# Patient Record
Sex: Female | Born: 1950 | Race: White | Hispanic: No | Marital: Married | Smoking: Never smoker
Health system: Southern US, Community
[De-identification: ages and names within clinical notes are randomized; demographics above are authoritative.]

## PROBLEM LIST (undated history)

## (undated) DIAGNOSIS — E079 Disorder of thyroid, unspecified: Secondary | ICD-10-CM

## (undated) DIAGNOSIS — I10 Essential (primary) hypertension: Secondary | ICD-10-CM

## (undated) HISTORY — PX: OTHER SURGICAL HISTORY: SHX169

## (undated) HISTORY — PX: ABDOMINAL HYSTERECTOMY: SHX81

---

## 2004-02-27 ENCOUNTER — Ambulatory Visit (HOSPITAL_BASED_OUTPATIENT_CLINIC_OR_DEPARTMENT_OTHER): Admission: RE | Admit: 2004-02-27 | Discharge: 2004-02-27 | Payer: Self-pay | Admitting: Orthopedic Surgery

## 2004-02-27 ENCOUNTER — Ambulatory Visit (HOSPITAL_COMMUNITY): Admission: RE | Admit: 2004-02-27 | Discharge: 2004-02-27 | Payer: Self-pay | Admitting: Orthopedic Surgery

## 2011-01-28 ENCOUNTER — Other Ambulatory Visit: Payer: Self-pay | Admitting: Internal Medicine

## 2011-02-02 NOTE — Telephone Encounter (Signed)
Opened in error

## 2015-03-16 ENCOUNTER — Inpatient Hospital Stay (HOSPITAL_BASED_OUTPATIENT_CLINIC_OR_DEPARTMENT_OTHER)
Admission: EM | Admit: 2015-03-16 | Discharge: 2015-03-19 | DRG: 194 | Disposition: A | Discharge status: Home / Self Care | Payer: Medicare Other | Attending: Internal Medicine | Admitting: Internal Medicine

## 2015-03-16 ENCOUNTER — Encounter (HOSPITAL_BASED_OUTPATIENT_CLINIC_OR_DEPARTMENT_OTHER): Payer: Self-pay | Admitting: Emergency Medicine

## 2015-03-16 ENCOUNTER — Emergency Department (HOSPITAL_BASED_OUTPATIENT_CLINIC_OR_DEPARTMENT_OTHER): Payer: Medicare Other

## 2015-03-16 DIAGNOSIS — E871 Hypo-osmolality and hyponatremia: Secondary | ICD-10-CM | POA: Diagnosis present

## 2015-03-16 DIAGNOSIS — R0902 Hypoxemia: Secondary | ICD-10-CM

## 2015-03-16 DIAGNOSIS — J181 Lobar pneumonia, unspecified organism: Principal | ICD-10-CM | POA: Diagnosis present

## 2015-03-16 DIAGNOSIS — R1012 Left upper quadrant pain: Secondary | ICD-10-CM | POA: Diagnosis present

## 2015-03-16 DIAGNOSIS — I10 Essential (primary) hypertension: Secondary | ICD-10-CM | POA: Diagnosis present

## 2015-03-16 DIAGNOSIS — E870 Hyperosmolality and hypernatremia: Secondary | ICD-10-CM | POA: Insufficient documentation

## 2015-03-16 DIAGNOSIS — E039 Hypothyroidism, unspecified: Secondary | ICD-10-CM | POA: Diagnosis present

## 2015-03-16 DIAGNOSIS — E869 Volume depletion, unspecified: Secondary | ICD-10-CM | POA: Diagnosis present

## 2015-03-16 DIAGNOSIS — W19XXXA Unspecified fall, initial encounter: Secondary | ICD-10-CM | POA: Diagnosis present

## 2015-03-16 DIAGNOSIS — T502X5A Adverse effect of carbonic-anhydrase inhibitors, benzothiadiazides and other diuretics, initial encounter: Secondary | ICD-10-CM | POA: Diagnosis present

## 2015-03-16 DIAGNOSIS — E876 Hypokalemia: Secondary | ICD-10-CM | POA: Diagnosis present

## 2015-03-16 DIAGNOSIS — J189 Pneumonia, unspecified organism: Secondary | ICD-10-CM | POA: Diagnosis not present

## 2015-03-16 DIAGNOSIS — R7989 Other specified abnormal findings of blood chemistry: Secondary | ICD-10-CM | POA: Diagnosis present

## 2015-03-16 DIAGNOSIS — K219 Gastro-esophageal reflux disease without esophagitis: Secondary | ICD-10-CM | POA: Diagnosis present

## 2015-03-16 DIAGNOSIS — B029 Zoster without complications: Secondary | ICD-10-CM | POA: Diagnosis present

## 2015-03-16 DIAGNOSIS — R0781 Pleurodynia: Secondary | ICD-10-CM | POA: Diagnosis present

## 2015-03-16 HISTORY — DX: Essential (primary) hypertension: I10

## 2015-03-16 HISTORY — DX: Disorder of thyroid, unspecified: E07.9

## 2015-03-16 LAB — CBC WITH DIFFERENTIAL/PLATELET
Band Neutrophils: 14 %
Basophils Absolute: 0 10*3/uL (ref 0.0–0.1)
Basophils Relative: 0 %
EOS ABS: 0 10*3/uL (ref 0.0–0.7)
EOS PCT: 0 %
HEMATOCRIT: 39.9 % (ref 36.0–46.0)
Hemoglobin: 13.6 g/dL (ref 12.0–15.0)
LYMPHS ABS: 0.4 10*3/uL — AB (ref 0.7–4.0)
Lymphocytes Relative: 2 %
MCH: 30 pg (ref 26.0–34.0)
MCHC: 34.1 g/dL (ref 30.0–36.0)
MCV: 88.1 fL (ref 78.0–100.0)
MONO ABS: 1.1 10*3/uL — AB (ref 0.1–1.0)
MYELOCYTES: 1 %
Monocytes Relative: 5 %
NEUTROS ABS: 20.7 10*3/uL — AB (ref 1.7–7.7)
NEUTROS PCT: 78 %
PLATELETS: 377 10*3/uL (ref 150–400)
RBC: 4.53 MIL/uL (ref 3.87–5.11)
RDW: 13 % (ref 11.5–15.5)
WBC: 22.2 10*3/uL — AB (ref 4.0–10.5)

## 2015-03-16 LAB — URINE MICROSCOPIC-ADD ON

## 2015-03-16 LAB — COMPREHENSIVE METABOLIC PANEL
ALBUMIN: 3.1 g/dL — AB (ref 3.5–5.0)
ALT: 23 U/L (ref 14–54)
ANION GAP: 11 (ref 5–15)
AST: 30 U/L (ref 15–41)
Alkaline Phosphatase: 117 U/L (ref 38–126)
BILIRUBIN TOTAL: 1 mg/dL (ref 0.3–1.2)
BUN: 20 mg/dL (ref 6–20)
CALCIUM: 8.7 mg/dL — AB (ref 8.9–10.3)
CO2: 26 mmol/L (ref 22–32)
Chloride: 97 mmol/L — ABNORMAL LOW (ref 101–111)
Creatinine, Ser: 1.14 mg/dL — ABNORMAL HIGH (ref 0.44–1.00)
GFR calc Af Amer: 57 mL/min — ABNORMAL LOW (ref >60)
GFR calc non Af Amer: 49 mL/min — ABNORMAL LOW (ref >60)
GLUCOSE: 142 mg/dL — AB (ref 65–99)
Potassium: 2.9 mmol/L — ABNORMAL LOW (ref 3.5–5.1)
Sodium: 134 mmol/L — ABNORMAL LOW (ref 135–145)
TOTAL PROTEIN: 7.7 g/dL (ref 6.5–8.1)

## 2015-03-16 LAB — URINALYSIS, ROUTINE W REFLEX MICROSCOPIC
Bilirubin Urine: NEGATIVE
GLUCOSE, UA: NEGATIVE mg/dL
Ketones, ur: NEGATIVE mg/dL
Nitrite: NEGATIVE
PROTEIN: NEGATIVE mg/dL
Specific Gravity, Urine: 1.013 (ref 1.005–1.030)
pH: 6 (ref 5.0–8.0)

## 2015-03-16 LAB — STREP PNEUMONIAE URINARY ANTIGEN: STREP PNEUMO URINARY ANTIGEN: NEGATIVE

## 2015-03-16 LAB — I-STAT CG4 LACTIC ACID, ED: Lactic Acid, Venous: 2.03 mmol/L (ref 0.5–2.0)

## 2015-03-16 LAB — TROPONIN I: Troponin I: <0.03 ng/mL (ref <0.031)

## 2015-03-16 MED ORDER — PANTOPRAZOLE SODIUM 40 MG PO TBEC
80.0000 mg | DELAYED_RELEASE_TABLET | Freq: Every day | ORAL | Status: DC
  Administered 2015-03-16 – 2015-03-19 (×4): 80 mg via ORAL
  Filled 2015-03-16 (×4): qty 2

## 2015-03-16 MED ORDER — FENTANYL CITRATE (PF) 100 MCG/2ML IJ SOLN: 50.0000 ug | Freq: Once | INTRAMUSCULAR | Status: DC

## 2015-03-16 MED ORDER — DEXTROSE 5 % IV SOLN
1.0000 g | INTRAVENOUS | Status: DC
  Administered 2015-03-17 – 2015-03-19 (×3): 1 g via INTRAVENOUS
  Filled 2015-03-16 (×3): qty 10

## 2015-03-16 MED ORDER — SODIUM CHLORIDE 0.9 % IJ SOLN
3.0000 mL | Freq: Two times a day (BID) | INTRAMUSCULAR | Status: DC
  Administered 2015-03-17 – 2015-03-19 (×4): 3 mL via INTRAVENOUS

## 2015-03-16 MED ORDER — ENOXAPARIN SODIUM 40 MG/0.4ML ~~LOC~~ SOLN
40.0000 mg | Freq: Every day | SUBCUTANEOUS | Status: DC
  Administered 2015-03-16 – 2015-03-18 (×3): 40 mg via SUBCUTANEOUS
  Filled 2015-03-16 (×3): qty 0.4

## 2015-03-16 MED ORDER — LEVOTHYROXINE SODIUM 88 MCG PO TABS
88.0000 ug | ORAL_TABLET | Freq: Every day | ORAL | Status: DC
  Administered 2015-03-17 – 2015-03-19 (×3): 88 ug via ORAL
  Filled 2015-03-16 (×3): qty 1

## 2015-03-16 MED ORDER — CEFTRIAXONE SODIUM 1 G IJ SOLR
INTRAMUSCULAR | Status: AC
  Filled 2015-03-16: qty 10

## 2015-03-16 MED ORDER — DEXTROSE 5 % IV SOLN: 500.0000 mg | INTRAVENOUS | Status: DC

## 2015-03-16 MED ORDER — CEFTRIAXONE SODIUM 1 G IJ SOLR
1.0000 g | Freq: Once | INTRAMUSCULAR | Status: AC
  Administered 2015-03-16: 1 g via INTRAVENOUS

## 2015-03-16 MED ORDER — ONDANSETRON HCL 4 MG/2ML IJ SOLN: 4.0000 mg | Freq: Four times a day (QID) | INTRAMUSCULAR | Status: DC | PRN

## 2015-03-16 MED ORDER — OXYBUTYNIN CHLORIDE ER 10 MG PO TB24
10.0000 mg | ORAL_TABLET | Freq: Every day | ORAL | Status: DC
  Administered 2015-03-16 – 2015-03-19 (×4): 10 mg via ORAL
  Filled 2015-03-16 (×5): qty 1

## 2015-03-16 MED ORDER — ACETAMINOPHEN 650 MG RE SUPP: 650.0000 mg | Freq: Four times a day (QID) | RECTAL | Status: DC | PRN

## 2015-03-16 MED ORDER — POTASSIUM CHLORIDE CRYS ER 20 MEQ PO TBCR
40.0000 meq | EXTENDED_RELEASE_TABLET | Freq: Once | ORAL | Status: AC
  Administered 2015-03-16: 40 meq via ORAL
  Filled 2015-03-16: qty 2

## 2015-03-16 MED ORDER — SODIUM CHLORIDE 0.9 % IV BOLUS (SEPSIS)
1000.0000 mL | Freq: Once | INTRAVENOUS | Status: AC
  Administered 2015-03-16: 1000 mL via INTRAVENOUS

## 2015-03-16 MED ORDER — SODIUM CHLORIDE 0.9 % IV BOLUS (SEPSIS)
500.0000 mL | Freq: Once | INTRAVENOUS | Status: AC
  Administered 2015-03-16: 500 mL via INTRAVENOUS

## 2015-03-16 MED ORDER — ACETAMINOPHEN 325 MG PO TABS: 650.0000 mg | ORAL_TABLET | Freq: Four times a day (QID) | ORAL | Status: DC | PRN

## 2015-03-16 MED ORDER — ESTRADIOL 1 MG PO TABS
1.0000 mg | ORAL_TABLET | Freq: Every day | ORAL | Status: DC
  Administered 2015-03-16 – 2015-03-19 (×4): 1 mg via ORAL
  Filled 2015-03-16 (×5): qty 1

## 2015-03-16 MED ORDER — SODIUM CHLORIDE 0.9 % IV SOLN
INTRAVENOUS | Status: DC
  Administered 2015-03-16: 19:00:00 via INTRAVENOUS
  Filled 2015-03-16 (×2): qty 1000

## 2015-03-16 MED ORDER — LEVOFLOXACIN 750 MG PO TABS
750.0000 mg | ORAL_TABLET | Freq: Every day | ORAL | Status: DC
  Administered 2015-03-16: 750 mg via ORAL
  Filled 2015-03-16: qty 1

## 2015-03-16 MED ORDER — HYDROCODONE-ACETAMINOPHEN 5-325 MG PO TABS: 1.0000 | ORAL_TABLET | Freq: Four times a day (QID) | ORAL | Status: DC | PRN

## 2015-03-16 MED ORDER — DEXTROSE 5 % IV SOLN
500.0000 mg | INTRAVENOUS | Status: DC
  Administered 2015-03-16 – 2015-03-18 (×3): 500 mg via INTRAVENOUS
  Filled 2015-03-16 (×4): qty 500

## 2015-03-16 MED ORDER — ONDANSETRON HCL 4 MG PO TABS: 4.0000 mg | ORAL_TABLET | Freq: Four times a day (QID) | ORAL | Status: DC | PRN

## 2015-03-16 MED ORDER — FLUTICASONE PROPIONATE 50 MCG/ACT NA SUSP: 1.0000 | Freq: Every day | NASAL | Status: DC | PRN

## 2015-03-16 NOTE — H&P (Signed)
History and Physical  Karla Farley FAO:[id number] DOB: November 26, 1950 DOA: 03/16/2015   PCP: No primary care provider on file.  Referring Physician: ED/ Evonnie Pat, PA-C  Chief Complaint: cough, malaise  HPI:  64 year old female with a history of hypertension, hypothyroidism, GERD presented with a 5 day history of cough and malaise. Approximately 10 days prior to this admission, the patient went to see her primary care provider for left flank pain. Apparently the patient was diagnosed with possible shingles and started on Valtrex.  The patient never developed a rash, although she stated that her left flank pain had improved with the Valtrex. After this medical issue, the patient had progressive malaise and began developing a cough and dyspnea on exertion approximate 5 days prior to this admission. The patient denies any hemoptysis, nausea, vomiting, diarrhea. She did not have any fevers or chills. However she developed right-sided flank pain. She attributed this to a mechanical fall approximately 3 days prior to this admission. She has not developed any rashes or synovitis. She denies any abdominal pain, dysuria, hematuria, vaginal discharge. Because of the cough, malaise, and dyspnea on exertion she came to the emergency department for further evaluation.  In emergency department workup revealed WBC 22.2 with a chest x-ray showing right middle lobe opacity suggestive of pneumonia. Lactic acid was 2.03. The patient was afebrile and hemodynamically stable. The patient was started on ceftriaxone and levofloxacin. Assessment/Plan: Community-acquired pneumonia -CURB 65 score= 2 -Continue ceftriaxone, start azithromycin -IV fluids -Blood cultures 2 sets--please note that this has been obtained after antibiotics -Pulmonary hygiene -Urine Legionella antigen, urine Streptococcus pneumoniae antigen Right greater than left flank pain -UA and urine culture -If there is significant pyuria and  hematuria, may need CT renal protocol -however, on physical examination, the patient has minimal to no pain with percussion -Hydrocodone when necessary pain Hypertension - hold losartan, HCTZ and metoprolol succinate due to self blood pressure Hypokalemia -Likely due to HCTZ -Replete -Check magnesium Hypothyroidism  -Continue Synthroid  GERD  -Start PPI  Hyponatremia -Secondary to poor oral intake/volume depletion -IV fluids      Past Medical History  Diagnosis Date  . Thyroid disease   . Hypertension    History reviewed. No pertinent past surgical history. Social History:  reports that she has never smoked. She does not have any smokeless tobacco history on file. She reports that she does not drink alcohol or use illicit drugs.   History reviewed. No pertinent family history.   Allergies  Allergen Reactions  . Demerol [Meperidine]     hypotension      Prior to Admission medications   Medication Sig Start Date End Date Taking? Authorizing Provider  estradiol (ESTRACE) 1 MG tablet Take 1 mg by mouth daily.   Yes Historical Provider, MD  fluticasone (FLONASE) 50 MCG/ACT nasal spray Place 1 spray into both nostrils daily as needed for allergies or rhinitis.    Yes Historical Provider, MD  hydrochlorothiazide (HYDRODIURIL) 25 MG tablet Take 25 mg by mouth daily.   Yes Historical Provider, MD  HYDROcodone-acetaminophen (NORCO/VICODIN) 5-325 MG tablet Take 1 tablet by mouth every 6 (six) hours as needed for moderate pain.   Yes Historical Provider, MD  ibuprofen (ADVIL,MOTRIN) 200 MG tablet Take 200 mg by mouth every 6 (six) hours as needed for mild pain or moderate pain.   Yes Historical Provider, MD  levothyroxine (SYNTHROID, LEVOTHROID) 88 MCG tablet Take 88 mcg by mouth daily before breakfast.   Yes Historical Provider,  MD  losartan (COZAAR) 25 MG tablet Take 25 mg by mouth daily.   Yes Historical Provider, MD  metoprolol succinate (TOPROL-XL) 100 MG 24 hr tablet Take  100 mg by mouth daily. Take with or immediately following a meal.   Yes Historical Provider, MD  omeprazole (PRILOSEC) 40 MG capsule Take 40 mg by mouth daily.   Yes Historical Provider, MD  oxybutynin (DITROPAN-XL) 10 MG 24 hr tablet Take 10 mg by mouth daily.    Yes Historical Provider, MD    Review of Systems:  Constitutional:  No weight loss, night sweats, Fevers, chills, fatigue.  Head&Eyes: No headache.  No vision loss.  No eye pain or scotoma ENT:  No Difficulty swallowing,Tooth/dental problems,Sore throat,  No ear ache, post nasal drip,  Cardio-vascular:  No chest pain, Orthopnea, PND, swelling in lower extremities,  dizziness, palpitations  GI:  No  abdominal pain, nausea, vomiting, diarrhea, loss of appetite, hematochezia, melena, heartburn, indigestion, Resp:   No coughing up of blood .No wheezing.No chest wall deformity  Skin:  no rash or lesions.  GU:  no dysuria, change in color of urine, no urgency or frequency. No flank pain.  Musculoskeletal:  No joint pain or swelling. No decreased range of motion. No back pain.  Psych:  No change in mood or affect. No depression or anxiety. Neurologic:  no dysesthesia, no focal weakness, no vision loss. No syncope  Physical Exam: Filed Vitals:   03/16/15 1133 03/16/15 1349 03/16/15 1429 03/16/15 1612  BP: 120/71 121/71 120/57 114/64  Pulse: 108 96 88 92  Temp: 98.3 F (36.8 C)   97.7 F (36.5 C)  TempSrc: Oral   Oral  Resp: Height:  (1.626 m)    (1.626 m)  Weight: 73.936 kg (163 lb)   73.6 kg (162 lb 4.1 oz)  SpO2: 100% 95% 95% 94%   General:  A&O x 3, NAD, nontoxic, pleasant/cooperative Head/Eye: No conjunctival hemorrhage, no icterus, [place]/AT, No nystagmus ENT:  No icterus,  No thrush, good dentition, no pharyngeal exudate Neck:  No masses, no lymphadenpathy, no bruits CV:  RRR, no rub, no gallop, no S3 Lung: Bibasilar crackles, recurrent left. No wheezing. Good air movement  Abdomen:  soft/NT, +BS, nondistended, no peritoneal signs no costovertebral tenderness; No hepatosplenomegaly;  Ext: No cyanosis, No rashes, No petechiae, No lymphangitis, No edema Neuro: CNII-XII intact, strength 4/5 in bilateral upper and lower extremities, no dysmetria  Labs on Admission:  Basic Metabolic Panel:  Recent Labs Lab 03/16/15 1221  NA 134*  K 2.9*  CL 97*  CO2 26  GLUCOSE 142*  BUN 20  CREATININE 1.14*  CALCIUM 8.7*   Liver Function Tests:  Recent Labs Lab 03/16/15 1221  AST 30  ALT 23  ALKPHOS 117  BILITOT 1.0  PROT 7.7  ALBUMIN 3.1*   No results for input(s): LIPASE, AMYLASE in the last 168 hours. No results for input(s): AMMONIA in the last 168 hours. CBC:  Recent Labs Lab 03/16/15 1221  WBC 22.2*  NEUTROABS 20.7*  HGB 13.6  HCT 39.9  MCV 88.1  PLT 377   Cardiac Enzymes:  Recent Labs Lab 03/16/15 1221  TROPONINI <0.03   BNP: Invalid input(s): POCBNP CBG: No results for input(s): GLUCAP in the last 168 hours.  Radiological Exams on Admission: Dg Chest 2 View  03/16/2015  CLINICAL DATA:  LEFT upper flank pain.  Nonproductive cough. EXAM: CHEST  2 VIEW COMPARISON:  None. FINDINGS: Normal cardiac  silhouette. RIGHT infrahilar opacity seen only on the frontal projection likely represents RIGHT middle lobe pneumonia. LEFT lung is clear. No pneumothorax. No acute osseous abnormality. IMPRESSION: Concern for RIGHT middle lobe pneumonia. Followup PA and lateral chest X-ray is recommended in 3-4 weeks following trial of antibiotic therapy to ensure resolution and exclude underlying malignancy. Electronically Signed   By: Genevive BiStewart  Edmunds M.D.   On: 03/16/2015 12:13        Time spent:60 minutes Code Status:   FULL Family Communication:   Husband updated at bedside   Caryl Fate, DO  [facility] Pager [phone]  If 7PM-7AM, please contact night-coverage [email or web address] Password [email or web address] 03/16/2015, 6:04 PM

## 2015-03-16 NOTE — ED Notes (Signed)
Comfort measures provided ?

## 2015-03-16 NOTE — ED Notes (Signed)
Discussed pain management as ordered by MD, pt states while lying still she is comfortable, would rather wait to have IV pain med

## 2015-03-16 NOTE — ED Notes (Signed)
States has poor appetite, unable to sleep due to cough.

## 2015-03-16 NOTE — ED Notes (Signed)
Pt reports left upper flank pain that started 1 week ago, seen at [facility] and told she had shingles, pt then fell at home next day and developed right upper flank pain, pt has no rash or s/s of shingles to upper left, now pt reports severe pain to left upper flank

## 2015-03-16 NOTE — ED Notes (Signed)
Presents today with complaints with pain left breast pain, primarily below left breast, onset over 1 week ago. [facility], was told she maybe having onset of shingles. Medication given was Valtrex, was instructed to complete dose. Returned last Saturday, was told to continue medication. Went to work on Monday, developed a cough. States has felt worse as time as passed

## 2015-03-16 NOTE — Progress Notes (Signed)
64 yr old F presents to [facility] with reports of  left upper flank pain that started 1 week ago, seen at [facility] and told she had shingles, pt then fell at home next day and developed right upper flank pain, pt has no rash or s/s of shingles to upper left, now pt reports severe pain to left upper flank, found to have a RML PNA, wbc 22K, tachy , but stable, K 2.9, repleting, admit to tele

## 2015-03-16 NOTE — ED Notes (Signed)
Pt up to bathroom , required steady device due to pt becoming short of breath with walking.

## 2015-03-16 NOTE — ED Notes (Signed)
PA-C notified of K level results

## 2015-03-16 NOTE — ED Provider Notes (Signed)
CSN: [id number]     Arrival date & time 03/16/15  1125 History   First MD Initiated Contact with Patient 03/16/15 1204     Chief Complaint  Patient presents with  . Flank Pain     (Consider location/radiation/quality/duration/timing/severity/associated sxs/prior Treatment) HPI Comments: Patient presents with multiple complaints. She developed left-sided flank pain approximately 10 days ago and was seen by PCP. She was diagnosed with possible shingles program started on Valtrex and was taking pain medications which helped. She has never developed a rash. Symptoms persisted but improved up until 6 days ago when patient developed a cough and worsening malaise. The left flank pain return. Patient did have a fall at one point because she was weak and had right flank pain afterwards. No fevers. Coughing was worse last night. She reports poor appetite has not been eating and drinking well. No chest pains. No abdominal pains. No vomiting, diarrhea, or urinary symptoms. No other treatments prior to arrival.  Patient is a 64 y.o. female presenting with flank pain. The history is provided by the patient.  Flank Pain Associated symptoms include coughing. Pertinent negatives include no abdominal pain, chest pain, congestion, fever, headaches, myalgias, nausea, rash, sore throat or vomiting.    Past Medical History  Diagnosis Date  . Thyroid disease   . Hypertension    History reviewed. No pertinent past surgical history. History reviewed. No pertinent family history. Social History  Substance Use Topics  . Smoking status: Never Smoker   . Smokeless tobacco: None  . Alcohol Use: No   OB History    No data available     Review of Systems  Constitutional: Negative for fever.  HENT: Negative for congestion, rhinorrhea and sore throat.   Eyes: Negative for redness.  Respiratory: Positive for cough and wheezing (Slight this morning). Negative for shortness of breath.   Cardiovascular: Negative  for chest pain.  Gastrointestinal: Negative for nausea, vomiting, abdominal pain and diarrhea.  Genitourinary: Positive for flank pain. Negative for dysuria.  Musculoskeletal: Negative for myalgias.  Skin: Negative for rash.  Neurological: Negative for headaches.    Allergies  Demerol  Home Medications   Prior to Admission medications   Medication Sig Start Date End Date Taking? Authorizing Provider  estradiol (ESTRACE) 1 MG tablet Take 1 mg by mouth daily.   Yes Historical Provider, MD  fluticasone (FLONASE) 50 MCG/ACT nasal spray Place into both nostrils daily.   Yes Historical Provider, MD  HYDROcodone-acetaminophen (NORCO/VICODIN) 5-325 MG tablet Take 1 tablet by mouth every 6 (six) hours as needed for moderate pain.   Yes Historical Provider, MD  levothyroxine (SYNTHROID, LEVOTHROID) 88 MCG tablet Take 88 mcg by mouth daily before breakfast.   Yes Historical Provider, MD  losartan (COZAAR) 25 MG tablet Take 25 mg by mouth daily.   Yes Historical Provider, MD  metoprolol succinate (TOPROL-XL) 100 MG 24 hr tablet Take 100 mg by mouth daily. Take with or immediately following a meal.   Yes Historical Provider, MD  nabumetone (RELAFEN) 750 MG tablet Take 750 mg by mouth daily.   Yes Historical Provider, MD  omeprazole (PRILOSEC) 40 MG capsule Take 40 mg by mouth daily.   Yes Historical Provider, MD  oxybutynin (DITROPAN-XL) 10 MG 24 hr tablet Take 10 mg by mouth at bedtime.   Yes Historical Provider, MD  valACYclovir (VALTREX) 1000 MG tablet Take 1,000 mg by mouth 2 (two) times daily.   Yes Historical Provider, MD   BP 120/71 mmHg  Pulse 108  Temp(Src) 98.3 F (36.8 C) (Oral)  Resp 20  Ht  (1.626 m)  Wt 73.936 kg  BMI 27.97 kg/m2  SpO2 100%   Physical Exam  Constitutional: She appears well-developed and well-nourished.  HENT:  Head: Normocephalic and atraumatic.  Eyes: Conjunctivae are normal. Right eye exhibits no discharge. Left eye exhibits no discharge.  Neck:  Normal range of motion. Neck supple.  Cardiovascular: Regular rhythm and normal heart sounds.  Tachycardia present.   No murmur heard. Slight tachycardia  Pulmonary/Chest: Effort normal. No respiratory distress. She has no wheezes. She has rales (right middle lobe). She exhibits tenderness (Left lateral ribs and right lateral ribs, moderately tender to palpation).  Abdominal: Soft. There is no tenderness.  Neurological: She is alert.  Skin: Skin is warm and dry.  Psychiatric: She has a normal mood and affect.  Nursing note and vitals reviewed.   ED Course  Procedures (including critical care time) Labs Review Labs Reviewed  CBC WITH DIFFERENTIAL/PLATELET - Abnormal; Notable for the following:    WBC 22.2 (*)    Neutro Abs 20.7 (*)    Lymphs Abs 0.4 (*)    Monocytes Absolute 1.1 (*)    All other components within normal limits  COMPREHENSIVE METABOLIC PANEL - Abnormal; Notable for the following:    Sodium 134 (*)    Potassium 2.9 (*)    Chloride 97 (*)    Glucose, Bld 142 (*)    Creatinine, Ser 1.14 (*)    Calcium 8.7 (*)    Albumin 3.1 (*)    GFR calc non Af Amer 49 (*)    GFR calc Af Amer 57 (*)    All other components within normal limits  URINALYSIS, ROUTINE W REFLEX MICROSCOPIC (NOT AT [facility]) - Abnormal; Notable for the following:    Hgb urine dipstick TRACE (*)    Leukocytes, UA SMALL (*)    All other components within normal limits  URINE MICROSCOPIC-ADD ON - Abnormal; Notable for the following:    Squamous Epithelial / LPF 0-5 (*)    Bacteria, UA RARE (*)    All other components within normal limits  I-STAT CG4 LACTIC ACID, ED - Abnormal; Notable for the following:    Lactic Acid, Venous 2.03 (*)    All other components within normal limits  CULTURE, BLOOD (ROUTINE X 2)  CULTURE, BLOOD (ROUTINE X 2)  URINE CULTURE  TROPONIN I  LACTIC ACID, PLASMA  BASIC METABOLIC PANEL  CBC  LEGIONELLA PNEUMOPHILA SEROGP 1 UR AG  STREP PNEUMONIAE URINARY ANTIGEN  MAGNESIUM     Imaging Review Dg Chest 2 View  03/16/2015  CLINICAL DATA:  LEFT upper flank pain.  Nonproductive cough. EXAM: CHEST  2 VIEW COMPARISON:  None. FINDINGS: Normal cardiac silhouette. RIGHT infrahilar opacity seen only on the frontal projection likely represents RIGHT middle lobe pneumonia. LEFT lung is clear. No pneumothorax. No acute osseous abnormality. IMPRESSION: Concern for RIGHT middle lobe pneumonia. Followup PA and lateral chest X-ray is recommended in 3-4 weeks following trial of antibiotic therapy to ensure resolution and exclude underlying malignancy. Electronically Signed   By: Genevive Bi M.D.   On: 03/16/2015 12:13   I have personally reviewed and evaluated these images and lab results as part of my medical decision-making.   EKG Interpretation None       12:47 PM Patient seen and examined. Work-up initiated. Medications ordered.   Vital signs reviewed and are as follows: BP 120/71 mmHg  Pulse 108  Temp(Src) 98.3  F (36.8 C) (Oral)  Resp 20  Ht 5\' 4"  (1.626 m)  Wt 73.936 kg  BMI 27.97 kg/m2  SpO2 100%  Patient appears uncomfortable, has mild tachycardia on arrival, white count greater than 20,000.  Patient discussed with Dr. Dalene SeltzerSchlossman.   Discussed findings with patient. Offered admission and patient would like to stay in the hospital until she begins to feel better. I feel that given her labs and findings today that this is reasonable. Will call for admission.  Spoke with Dr. Susie CassetteAbrol. [facility]  MDM   Final diagnoses:  Community acquired pneumonia   [facility].    [place]ua Riddik Senna, [place]PA-C 03/16/15 1953  Alvira MondayErin Schlossman, MD 03/24/15 1929

## 2015-03-16 NOTE — ED Notes (Signed)
Phone Hand Off report provided to rec RN, opportunity for questions provided

## 2015-03-16 NOTE — ED Notes (Signed)
No rash noted at left flank, states pain is at left flank now and radiates to front to under left breast.

## 2015-03-16 NOTE — ED Notes (Signed)
Phone Report provided to [facility] Transport Team 

## 2015-03-16 NOTE — ED Notes (Signed)
PA-C in with pt

## 2015-03-17 ENCOUNTER — Inpatient Hospital Stay (HOSPITAL_COMMUNITY): Payer: Medicare Other

## 2015-03-17 DIAGNOSIS — E876 Hypokalemia: Secondary | ICD-10-CM

## 2015-03-17 DIAGNOSIS — I10 Essential (primary) hypertension: Secondary | ICD-10-CM

## 2015-03-17 DIAGNOSIS — J181 Lobar pneumonia, unspecified organism: Principal | ICD-10-CM

## 2015-03-17 DIAGNOSIS — R7989 Other specified abnormal findings of blood chemistry: Secondary | ICD-10-CM | POA: Diagnosis present

## 2015-03-17 DIAGNOSIS — R0781 Pleurodynia: Secondary | ICD-10-CM | POA: Diagnosis present

## 2015-03-17 DIAGNOSIS — R791 Abnormal coagulation profile: Secondary | ICD-10-CM

## 2015-03-17 LAB — BASIC METABOLIC PANEL
Anion gap: 6 (ref 5–15)
BUN: 12 mg/dL (ref 6–20)
CO2: 28 mmol/L (ref 22–32)
Calcium: 8.1 mg/dL — ABNORMAL LOW (ref 8.9–10.3)
Chloride: 105 mmol/L (ref 101–111)
Creatinine, Ser: 0.98 mg/dL (ref 0.44–1.00)
GFR calc Af Amer: >60 mL/min (ref >60)
GFR, EST NON AFRICAN AMERICAN: 59 mL/min — AB (ref >60)
Glucose, Bld: 118 mg/dL — ABNORMAL HIGH (ref 65–99)
POTASSIUM: 3.6 mmol/L (ref 3.5–5.1)
SODIUM: 139 mmol/L (ref 135–145)

## 2015-03-17 LAB — D-DIMER, QUANTITATIVE (NOT AT ARMC): D DIMER QUANT: 2.86 ug{FEU}/mL — AB (ref 0.00–0.50)

## 2015-03-17 LAB — CBC
HEMATOCRIT: 34 % — AB (ref 36.0–46.0)
Hemoglobin: 11.5 g/dL — ABNORMAL LOW (ref 12.0–15.0)
MCH: 30.3 pg (ref 26.0–34.0)
MCHC: 33.8 g/dL (ref 30.0–36.0)
MCV: 89.7 fL (ref 78.0–100.0)
PLATELETS: 299 10*3/uL (ref 150–400)
RBC: 3.79 MIL/uL — ABNORMAL LOW (ref 3.87–5.11)
RDW: 13.4 % (ref 11.5–15.5)
WBC: 16.8 10*3/uL — AB (ref 4.0–10.5)

## 2015-03-17 LAB — MAGNESIUM: Magnesium: 1.9 mg/dL (ref 1.7–2.4)

## 2015-03-17 IMAGING — CT CT ANGIO CHEST
1 of 8 series · 17 of 36 positions shown · IV contrast (Iohexol (Omnipaque 350))
Comparison: No priors.

CLINICAL DATA: 63-year-old female with pleuritic chest pain.

EXAM:
CT ANGIOGRAPHY CHEST WITH CONTRAST
TECHNIQUE: Multidetector CT imaging of the chest was performed using the
standard protocol during bolus administration of intravenous
contrast. Multiplanar CT image reconstructions and MIPs were
obtained to evaluate the vascular anatomy.
CONTRAST:  100mL OMNIPAQUE IOHEXOL 350 MG/ML SOLN

[Series 406: thins pacs · axial · 0.67mm/px · z∈[-26,+227]mm · 17 of 285 slices shown]
[im 16/285  lung]
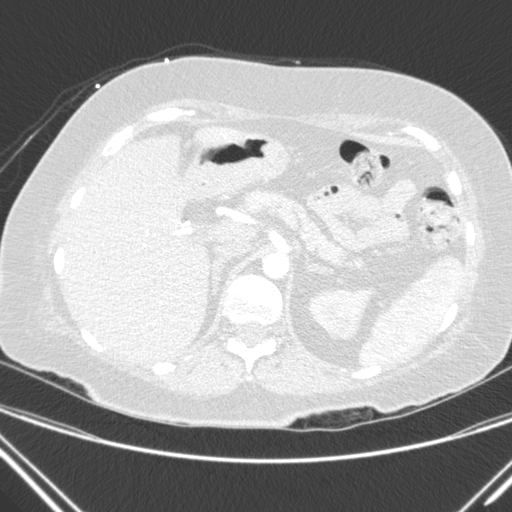
[im 32/285  mediastinal]
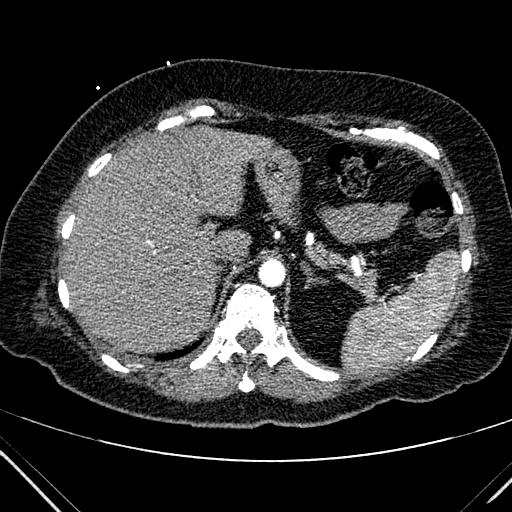
[im 48/285  lung]
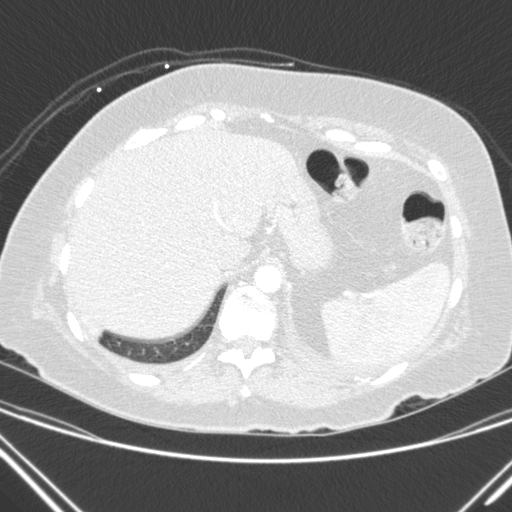
[im 64/285  mediastinal]
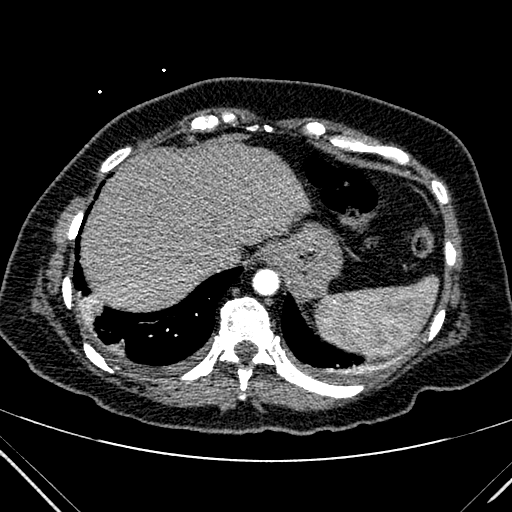
[im 79/285  lung]
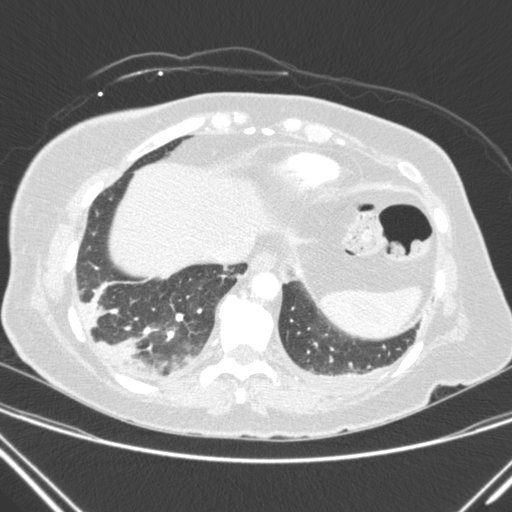
[im 95/285  mediastinal]
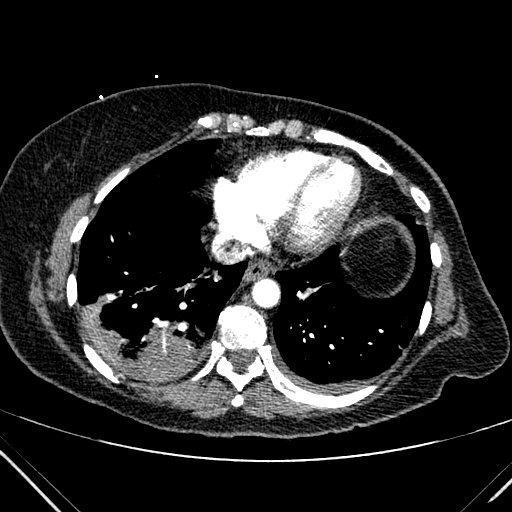
[im 111/285  lung]
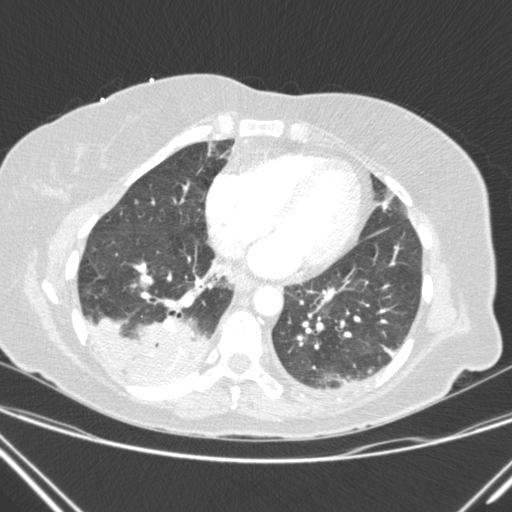
[im 127/285  mediastinal]
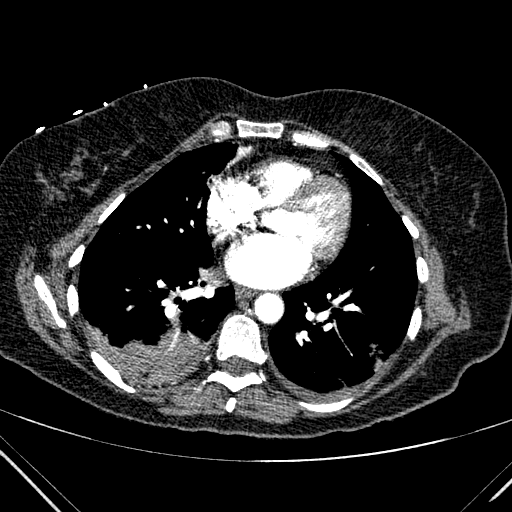
[im 143/285  lung]
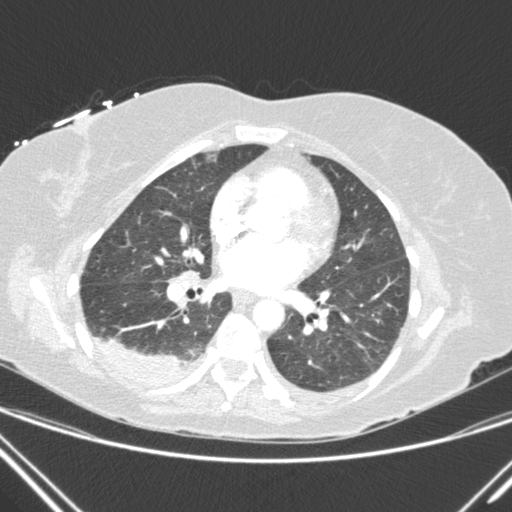
[im 158/285  mediastinal]
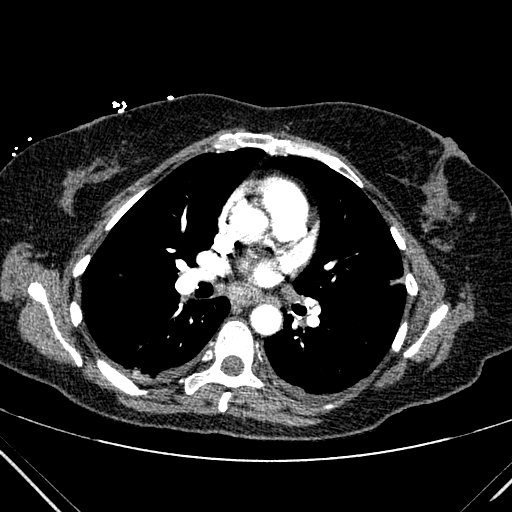
[im 174/285  lung]
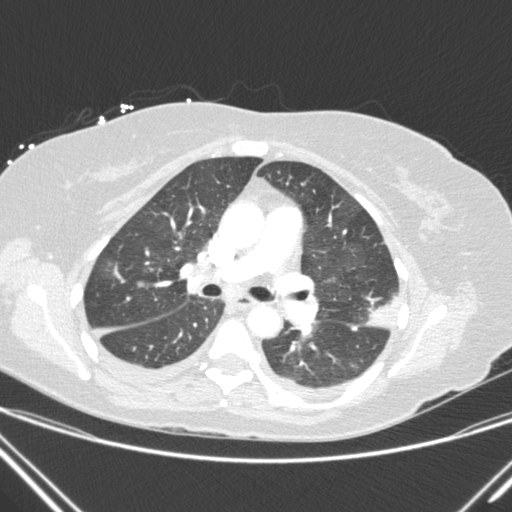
[im 190/285  mediastinal]
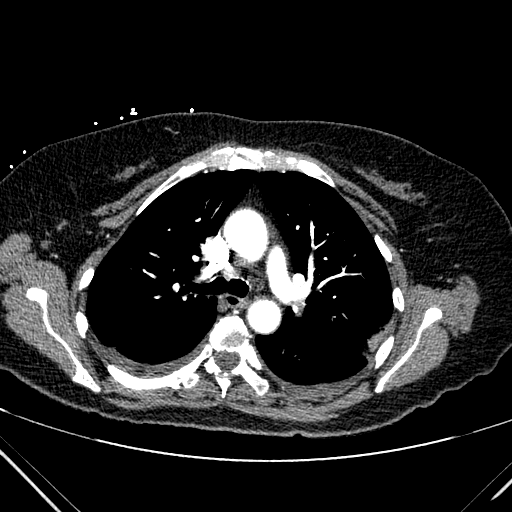
[im 206/285  lung]
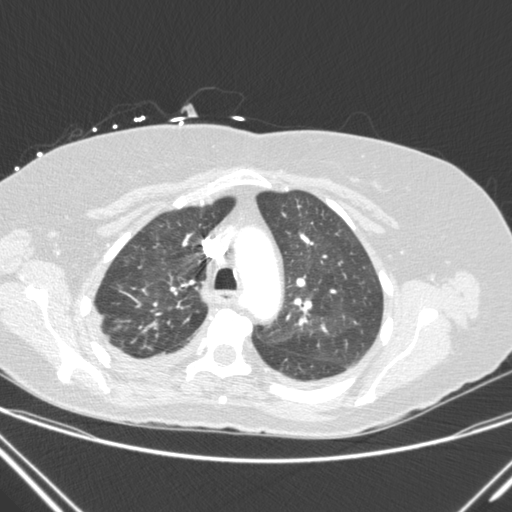
[im 221/285  mediastinal]
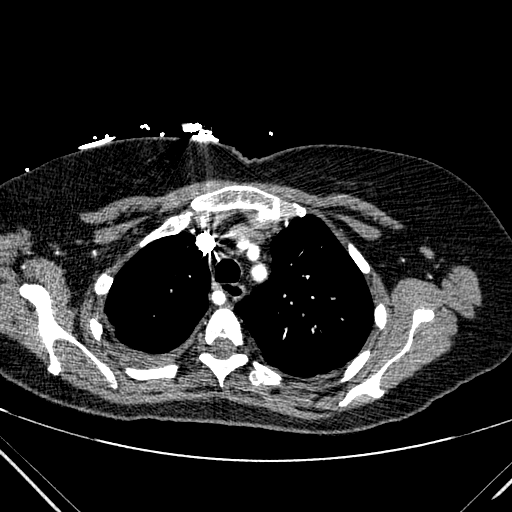
[im 237/285  lung]
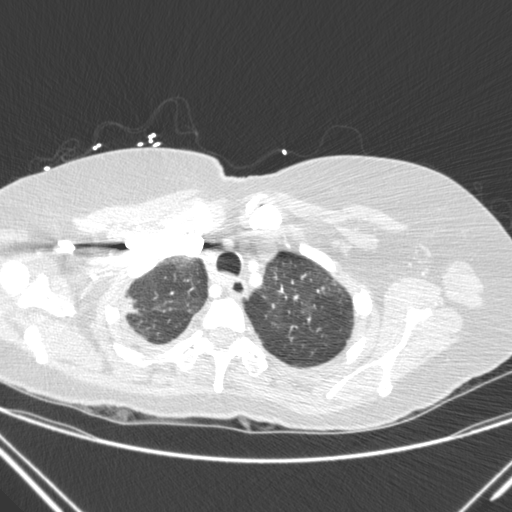
[im 253/285  mediastinal]
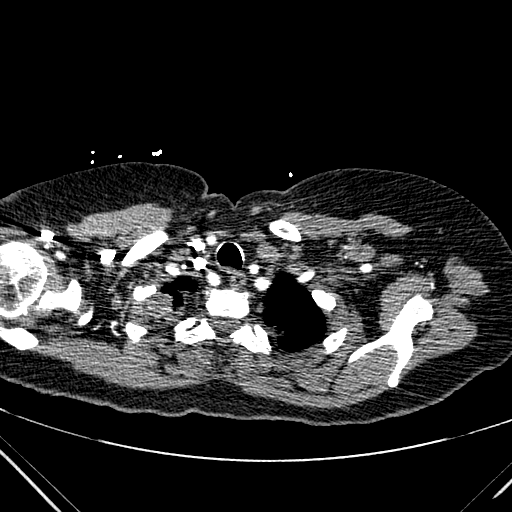
[im 269/285  lung]
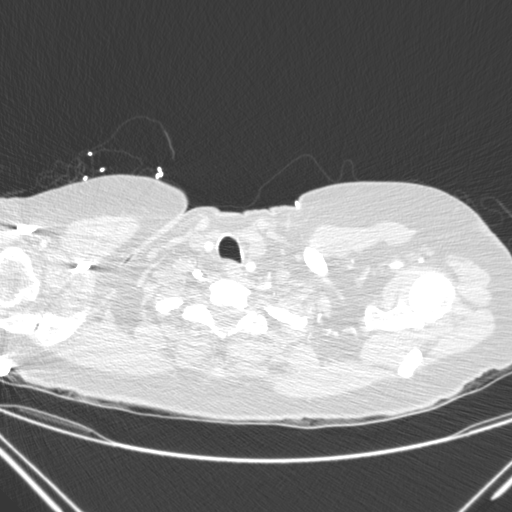

[17 of 36 positions shown; findings below may reference images not displayed]

FINDINGS: Mediastinum/Lymph Nodes: There are no filling defects within the
pulmonary arterial tree to suggest underlying pulmonary embolism.
Heart size is normal. There is no significant pericardial fluid,
thickening or pericardial calcification. There is atherosclerosis of
the thoracic aorta, the great vessels of the mediastinum and the
coronary arteries, including calcified atherosclerotic plaque in the
left main and left circumflex coronary arteries. Prominent
borderline enlarged bilateral hilar lymph nodes measuring up to 9 mm
are presumably reactive. Mildly enlarged 1 cm short axis subcarinal
lymph node, also likely reactive. Several densely calcified left
hilar lymph nodes are noted. Aberrant right subclavian artery
(normal anatomical variant) incidentally noted. Esophagus is
unremarkable in appearance. No axillary lymphadenopathy.

Lungs/Pleura: Multifocal airspace consolidation throughout the lungs
bilaterally, with the largest area of consolidation in the posterior
aspect of the right lower lobe. Several of the areas of airspace
consolidation are slightly nodular in appearance, however, the
overall appearance is favored to reflect a multilobar pneumonia.
Trace dependent bilateral pleural effusions. Mild diffuse bronchial
wall thickening. Septal thickening throughout the lower lobes of the
lungs bilaterally. A few scattered calcified granulomas are noted in
the left lung.

Upper Abdomen: Status post cholecystectomy.

Musculoskeletal/Soft Tissues: There are no aggressive appearing
lytic or blastic lesions noted in the visualized portions of the
skeleton.

Review of the MIP images confirms the above findings.
IMPRESSION: 1. No evidence of pulmonary embolism.
2. Multilobar pneumonia, most severe in the right lower lobe, as
above.
3. Trace bilateral pleural effusions.
4. Atherosclerosis, including left main and left circumflex coronary
artery disease. Please note that although the presence of coronary
artery calcium documents the presence of coronary artery disease,
the severity of this disease and any potential stenosis cannot be
assessed on this non-gated CT examination. Assessment for potential
risk factor modification, dietary therapy or pharmacologic therapy
may be warranted, if clinically indicated.
5. Sequela of old granulomatous disease, as above.
6. Status post cholecystectomy.

## 2015-03-17 MED ORDER — GUAIFENESIN-DM 100-10 MG/5ML PO SYRP
5.0000 mL | ORAL_SOLUTION | ORAL | Status: DC | PRN
  Administered 2015-03-17 – 2015-03-18 (×2): 5 mL via ORAL
  Filled 2015-03-17 (×3): qty 5

## 2015-03-17 MED ORDER — POTASSIUM CHLORIDE IN NACL 20-0.9 MEQ/L-% IV SOLN
INTRAVENOUS | Status: DC
  Filled 2015-03-17: qty 1000

## 2015-03-17 MED ORDER — IOHEXOL 350 MG/ML SOLN
100.0000 mL | Freq: Once | INTRAVENOUS | Status: AC | PRN
  Administered 2015-03-17: 100 mL via INTRAVENOUS

## 2015-03-17 NOTE — Progress Notes (Signed)
[facility] Progress Note    Patient: Karla Farley     ZOX:[id number]  DOB: Jun 03, 1950     DOA: 03/16/2015 Date of Service: the patient was seen and examined on 03/17/2015 Day 1 of admission.  Subjective: c/o left sided stabbing chest pain worsening with movement and breathing, dry cough, no fever reported Nutrition: able to tolerate oral diet Activity: ambulating in the room Last BM: 03/17/2015  Assessment and Plan: 1. Lobar pneumonia [facility]) Patient presented with complains of left-sided chest pain. She was found to having right-sided pneumonia. She was also having leukocytosis. She denies having any fever or sputum expectoration. Continue ceftriaxone and azithromycin. Next and follow the cultures. Follow other workup.  2. Pleuritic chest pain. Less likely cardiac. Patient is taking estrogen on a daily basis. With this the patient is at high risk for developing thromboembolism. D-dimer is also positive. We will check CT PE protocol to rule out any PE.  3  Essential hypertension Blood pressure currently stable to continue holding her antihypertensive medication.  4  Hypokalemia  Hyponatremia Resolved at present.  5. Hypothyroidism. Continuing Synthroid.  DVT Prophylaxis: subcutaneous Heparin Nutrition: regular diet Advance goals of care discussion: full code  Brief Summary of Hospitalization:  HPI: As per the H&P dictated by Dr. Arbutus Leas on 03/16/2015 Patient presented with 5 day history of cough and malaise. Patient was also reporting to have shingles and was started on Valtrex. He was found to be having WBC of 22 with chest x-ray showed right middle lobe opacity and lactic acid elevation and was admitted for community-acquired pneumonia.  Daily update: 03/16/2015 admitted in the hospital and started on antibiotics. 03/17/2015 CT PE negative Consultants: none Procedures: none Antibiotics: Anti-infectives    Start     Dose/Rate Route Frequency Ordered Stop   03/17/15  1000  azithromycin (ZITHROMAX) 500 mg in dextrose 5 % 250 mL IVPB  Status:  Discontinued     500 mg 250 mL/hr over 60 Minutes Intravenous Every 24 hours 03/16/15 1815 03/16/15 1855   03/17/15 0800  cefTRIAXone (ROCEPHIN) 1 g in dextrose 5 % 50 mL IVPB     1 g 100 mL/hr over 30 Minutes Intravenous Every 24 hours 03/16/15 1815     03/16/15 2100  azithromycin (ZITHROMAX) 500 mg in dextrose 5 % 250 mL IVPB     500 mg 250 mL/hr over 60 Minutes Intravenous Every 24 hours 03/16/15 1855     03/16/15 1345  cefTRIAXone (ROCEPHIN) 1 g in dextrose 5 % 50 mL IVPB     1 g 100 mL/hr over 30 Minutes Intravenous  Once 03/16/15 1337 03/16/15 1401   03/16/15 1342  cefTRIAXone (ROCEPHIN) 1 G injection    Comments:  Juanda Crumble   : cabinet override      03/16/15 1342 03/16/15 1350   03/16/15 1300  levofloxacin (LEVAQUIN) tablet 750 mg  Status:  Discontinued     750 mg Oral Daily 03/16/15 1248 03/16/15 1815     Family Communication: family was present at bedside, opportunity was given to ask question and all questions were answered satisfactorily at the time of interview.  Disposition:  Expected discharge date:03/19/2015 Barriers to safe discharge: improvement in breathing    Intake/Output Summary (Last 24 hours) at 03/17/15 0728 Last data filed at 03/17/15 0600  Gross per 24 hour  Intake 2952.5 ml  Output   1100 ml  Net 1852.5 ml   Filed Weights   03/16/15 1133 03/16/15 1612 03/17/15 0432  Weight: 73.936 kg (  163 lb) 73.6 kg (162 lb 4.1 oz) 73.256 kg (161 lb 8 oz)    Objective: Physical Exam: Filed Vitals:   03/16/15 1612 03/16/15 2017 03/16/15 2325 03/17/15 0432  BP: 114/64 117/47 120/57 127/59  Pulse: 92 83 80 82  Temp: 97.7 F (36.5 C) 98.1 F (36.7 C)  97 F (36.1 C)  TempSrc: Oral Oral  Oral  Resp: 20 18 18 18   Height: 5\' 4"  (1.626 m)     Weight: 73.6 kg (162 lb 4.1 oz)   73.256 kg (161 lb 8 oz)  SpO2: 94% 97% 98% 98%     General: Appear in mild distress, no Rash; Oral  Mucosa moist. Cardiovascular: S1 and S2 Present, no Murmur, no JVD Respiratory: Bilateral Air entry present and bilateral Crackles, no wheezes Abdomen: Bowel Sound present, Soft and no tenderness Extremities: no Pedal edema, no calf tenderness Neurology: Grossly no focal neuro deficit.  Data Reviewed: CBC:  Recent Labs Lab 03/16/15 1221 03/17/15 0238  WBC 22.2* 16.8*  NEUTROABS 20.7*  --   HGB 13.6 11.5*  HCT 39.9 34.0*  MCV 88.1 89.7  PLT 377 299   Basic Metabolic Panel:  Recent Labs Lab 03/16/15 1221 03/17/15 0238  NA 134* 139  K 2.9* 3.6  CL 97* 105  CO2 26 28  GLUCOSE 142* 118*  BUN 20 12  CREATININE 1.14* 0.98  CALCIUM 8.7* 8.1*  MG  --  1.9   Liver Function Tests:  Recent Labs Lab 03/16/15 1221  AST 30  ALT 23  ALKPHOS 117  BILITOT 1.0  PROT 7.7  ALBUMIN 3.1*   No results for input(s): LIPASE, AMYLASE in the last 168 hours. No results for input(s): AMMONIA in the last 168 hours.  Cardiac Enzymes:  Recent Labs Lab 03/16/15 1221  TROPONINI <0.03   BNP (last 3 results) No results for input(s): BNP in the last 8760 hours.  ProBNP (last 3 results) No results for input(s): PROBNP in the last 8760 hours.   CBG: No results for input(s): GLUCAP in the last 168 hours.  No results found for this or any previous visit (from the past 240 hour(s)).   Studies: Ct Angio Chest Pe W/cm &/or Wo Cm  03/17/2015  CLINICAL DATA:  64 year old female with pleuritic chest pain. EXAM: CT ANGIOGRAPHY CHEST WITH CONTRAST TECHNIQUE: Multidetector CT imaging of the chest was performed using the standard protocol during bolus administration of intravenous contrast. Multiplanar CT image reconstructions and MIPs were obtained to evaluate the vascular anatomy. CONTRAST:  100mL OMNIPAQUE IOHEXOL 350 MG/ML SOLN COMPARISON:  No priors. FINDINGS: Mediastinum/Lymph Nodes: There are no filling defects within the pulmonary arterial tree to suggest underlying pulmonary embolism.  Heart size is normal. There is no significant pericardial fluid, thickening or pericardial calcification. There is atherosclerosis of the thoracic aorta, the great vessels of the mediastinum and the coronary arteries, including calcified atherosclerotic plaque in the left main and left circumflex coronary arteries. Prominent borderline enlarged bilateral hilar lymph nodes measuring up to 9 mm are presumably reactive. Mildly enlarged 1 cm short axis subcarinal lymph node, also likely reactive. Several densely calcified left hilar lymph nodes are noted. Aberrant right subclavian artery (normal anatomical variant) incidentally noted. Esophagus is unremarkable in appearance. No axillary lymphadenopathy. Lungs/Pleura: Multifocal airspace consolidation throughout the lungs bilaterally, with the largest area of consolidation in the posterior aspect of the right lower lobe. Several of the areas of airspace consolidation are slightly nodular in appearance, however, the overall appearance is favored to  reflect a multilobar pneumonia. Trace dependent bilateral pleural effusions. Mild diffuse bronchial wall thickening. Septal thickening throughout the lower lobes of the lungs bilaterally. A few scattered calcified granulomas are noted in the left lung. Upper Abdomen: Status post cholecystectomy. Musculoskeletal/Soft Tissues: There are no aggressive appearing lytic or blastic lesions noted in the visualized portions of the skeleton. Review of the MIP images confirms the above findings. IMPRESSION: 1. No evidence of pulmonary embolism. 2. Multilobar pneumonia, most severe in the right lower lobe, as above. 3. Trace bilateral pleural effusions. 4. Atherosclerosis, including left main and left circumflex coronary artery disease. Please note that although the presence of coronary artery calcium documents the presence of coronary artery disease, the severity of this disease and any potential stenosis cannot be assessed on this  non-gated CT examination. Assessment for potential risk factor modification, dietary therapy or pharmacologic therapy may be warranted, if clinically indicated. 5. Sequela of old granulomatous disease, as above. 6. Status post cholecystectomy. Electronically Signed   By: Trudie Reed M.D.   On: 03/17/2015 17:38     Scheduled Meds: . azithromycin  500 mg Intravenous Q24H  . cefTRIAXone (ROCEPHIN)  IV  1 g Intravenous Q24H  . enoxaparin (LOVENOX) injection  40 mg Subcutaneous QHS  . estradiol  1 mg Oral Daily  . levothyroxine  88 mcg Oral QAC breakfast  . oxybutynin  10 mg Oral Daily  . pantoprazole  80 mg Oral Daily  . sodium chloride  3 mL Intravenous Q12H   Continuous Infusions:    Time spent: 30 minutes  Author: Lynden Oxford, MD [facility] Pager: [phone] 03/17/2015 7:28 AM  If 7PM-7AM, please contact night-coverage at [email or web address], password [facility]

## 2015-03-17 NOTE — Progress Notes (Signed)
Utilization review completed.  

## 2015-03-18 DIAGNOSIS — J189 Pneumonia, unspecified organism: Secondary | ICD-10-CM

## 2015-03-18 LAB — BASIC METABOLIC PANEL
Anion gap: 8 (ref 5–15)
BUN: 10 mg/dL (ref 6–20)
CHLORIDE: 105 mmol/L (ref 101–111)
CO2: 25 mmol/L (ref 22–32)
CREATININE: 0.83 mg/dL (ref 0.44–1.00)
Calcium: 8 mg/dL — ABNORMAL LOW (ref 8.9–10.3)
Glucose, Bld: 100 mg/dL — ABNORMAL HIGH (ref 65–99)
POTASSIUM: 3.1 mmol/L — AB (ref 3.5–5.1)
SODIUM: 138 mmol/L (ref 135–145)

## 2015-03-18 LAB — URINE CULTURE: CULTURE: NO GROWTH

## 2015-03-18 LAB — CBC
HCT: 33.1 % — ABNORMAL LOW (ref 36.0–46.0)
Hemoglobin: 11 g/dL — ABNORMAL LOW (ref 12.0–15.0)
MCH: 30.1 pg (ref 26.0–34.0)
MCHC: 33.2 g/dL (ref 30.0–36.0)
MCV: 90.4 fL (ref 78.0–100.0)
PLATELETS: 293 10*3/uL (ref 150–400)
RBC: 3.66 MIL/uL — AB (ref 3.87–5.11)
RDW: 13.4 % (ref 11.5–15.5)
WBC: 12.2 10*3/uL — AB (ref 4.0–10.5)

## 2015-03-18 MED ORDER — POTASSIUM CHLORIDE CRYS ER 20 MEQ PO TBCR
40.0000 meq | EXTENDED_RELEASE_TABLET | Freq: Once | ORAL | Status: AC
  Administered 2015-03-18: 40 meq via ORAL
  Filled 2015-03-18: qty 2

## 2015-03-18 MED ORDER — LOSARTAN POTASSIUM 25 MG PO TABS
25.0000 mg | ORAL_TABLET | Freq: Every day | ORAL | Status: DC
  Administered 2015-03-18 – 2015-03-19 (×2): 25 mg via ORAL
  Filled 2015-03-18 (×2): qty 1

## 2015-03-18 NOTE — Progress Notes (Signed)
[facility] Progress Note    Patient: Karla Farley     ZOX:[id number]  DOB: [birth year]     DOA: 03/16/2015 Date of Service: the patient was seen and examined on 03/18/2015 Day 2 of admission.  Subjective: Pain has improved significantly. Appetite has also improved. Nutrition: able to tolerate oral diet Activity: ambulating in the hallway Last BM: 03/17/2015  Assessment and Plan: 1. Lobar pneumonia [facility]) Patient presented with complains of left-sided chest pain. She was found to having right-sided pneumonia. She was also having leukocytosis. Continue ceftriaxone and azithromycin. Blood cultures remain negative. Streptococcal antigen negative No sputum culture  2. Pleuritic chest pain. Less likely cardiac. Patient is taking estrogen on a daily basis. With this the patient is at high risk for developing thromboembolism. D-dimer is also positive. CT PE is performed which is negative for any blood clot and shows multilobar pneumonia.   3  Essential hypertension Blood pressure currently stable. Will resume losartan  4  Hypokalemia  Hyponatremia Resolved at present.  5. Hypothyroidism. Continuing Synthroid.  DVT Prophylaxis: subcutaneous Heparin Nutrition: regular diet  Advance goals of care discussion: full code  Brief Summary of Hospitalization:  HPI: As per the H&P dictated by Dr. Arbutus Leas on 03/16/2015 Patient presented with 5 day history of cough and malaise. Patient was also reporting to have shingles and was started on Valtrex. He was found to be having WBC of 22 with chest x-ray showed right middle lobe opacity and lactic acid elevation and was admitted for community-acquired pneumonia.  Daily update: 03/16/2015 admitted in the hospital and started on antibiotics. 03/17/2015 CT PE negative Consultants: none Procedures: none Antibiotics: Anti-infectives    Start     Dose/Rate Route Frequency Ordered Stop   03/17/15 1000  azithromycin (ZITHROMAX) 500 mg in dextrose 5  % 250 mL IVPB  Status:  Discontinued     500 mg 250 mL/hr over 60 Minutes Intravenous Every 24 hours 03/16/15 1815 03/16/15 1855   03/17/15 0800  cefTRIAXone (ROCEPHIN) 1 g in dextrose 5 % 50 mL IVPB     1 g 100 mL/hr over 30 Minutes Intravenous Every 24 hours 03/16/15 1815     03/16/15 2100  azithromycin (ZITHROMAX) 500 mg in dextrose 5 % 250 mL IVPB     500 mg 250 mL/hr over 60 Minutes Intravenous Every 24 hours 03/16/15 1855     03/16/15 1345  cefTRIAXone (ROCEPHIN) 1 g in dextrose 5 % 50 mL IVPB     1 g 100 mL/hr over 30 Minutes Intravenous  Once 03/16/15 1337 03/16/15 1401   03/16/15 1342  cefTRIAXone (ROCEPHIN) 1 G injection    Comments:  Juanda Crumble   : cabinet override      03/16/15 1342 03/16/15 1350   03/16/15 1300  levofloxacin (LEVAQUIN) tablet 750 mg  Status:  Discontinued     750 mg Oral Daily 03/16/15 1248 03/16/15 1815     Family Communication: family was present at bedside, opportunity was given to ask question and all questions were answered satisfactorily at the time of interview.  Disposition:  Expected discharge date:03/19/2015 Barriers to safe discharge: improvement in breathing    Intake/Output Summary (Last 24 hours) at 03/18/15 1559 Last data filed at 03/18/15 1400  Gross per 24 hour  Intake   1030 ml  Output   1176 ml  Net   -146 ml   Filed Weights   03/16/15 1612 03/17/15 0432 03/18/15 0441  Weight: 73.6 kg (162 lb 4.1 oz) 73.256 kg (161  lb 8 oz) 72.984 kg (160 lb 14.4 oz)    Objective: Physical Exam: Filed Vitals:   03/17/15 1347 03/17/15 2045 03/18/15 0441 03/18/15 1240  BP: 134/57 135/60 133/69 132/69  Pulse: 90 88 85 88  Temp: 99.2 F (37.3 C) 98.7 F (37.1 C) 98.5 F (36.9 C) 98.2 F (36.8 C)  TempSrc: Oral Oral Oral Oral  Resp: 20 20 17 18   Height:      Weight:   72.984 kg (160 lb 14.4 oz)   SpO2: 95% 94% 93% 96%   General: Appear in mild distress, no Rash; Oral Mucosa moist. Cardiovascular: S1 and S2 Present, no Murmur, no  JVD Respiratory: Bilateral Air entry present and bilateral Crackles, no wheezes Abdomen: Bowel Sound present, Soft and no tenderness Extremities: no Pedal edema, no calf tenderness  Data Reviewed: CBC:  Recent Labs Lab 03/16/15 1221 03/17/15 0238 03/18/15 0352  WBC 22.2* 16.8* 12.2*  NEUTROABS 20.7*  --   --   HGB 13.6 11.5* 11.0*  HCT 39.9 34.0* 33.1*  MCV 88.1 89.7 90.4  PLT 377 299 293   Basic Metabolic Panel:  Recent Labs Lab 03/16/15 1221 03/17/15 0238 03/18/15 0352  NA 134* 139 138  K 2.9* 3.6 3.1*  CL 97* 105 105  CO2 26 28 25   GLUCOSE 142* 118* 100*  BUN 20 12 10   CREATININE 1.14* 0.98 0.83  CALCIUM 8.7* 8.1* 8.0*  MG  --  1.9  --    Liver Function Tests:  Recent Labs Lab 03/16/15 1221  AST 30  ALT 23  ALKPHOS 117  BILITOT 1.0  PROT 7.7  ALBUMIN 3.1*   No results for input(s): LIPASE, AMYLASE in the last 168 hours. No results for input(s): AMMONIA in the last 168 hours.  Cardiac Enzymes:  Recent Labs Lab 03/16/15 1221  TROPONINI <0.03   BNP (last 3 results) No results for input(s): BNP in the last 8760 hours.  ProBNP (last 3 results) No results for input(s): PROBNP in the last 8760 hours.   CBG: No results for input(s): GLUCAP in the last 168 hours.  Recent Results (from the past 240 hour(s))  Urine culture     Status: None   Collection Time: 03/16/15  6:42 PM  Result Value Ref Range Status   Specimen Description URINE, CLEAN CATCH  Final   Special Requests NONE  Final   Culture NO GROWTH 2 DAYS  Final   Report Status 03/18/2015 FINAL  Final  Culture, blood (routine x 2)     Status: None (Preliminary result)   Collection Time: 03/16/15  7:07 PM  Result Value Ref Range Status   Specimen Description BLOOD RIGHT ARM  Final   Special Requests BOTTLES DRAWN AEROBIC AND ANAEROBIC 6CC  Final   Culture NO GROWTH 2 DAYS  Final   Report Status PENDING  Incomplete  Culture, blood (routine x 2)     Status: None (Preliminary result)    Collection Time: 03/16/15  7:18 PM  Result Value Ref Range Status   Specimen Description BLOOD RIGHT HAND  Final   Special Requests BOTTLES DRAWN AEROBIC AND ANAEROBIC 5CC   Final   Culture NO GROWTH 2 DAYS  Final   Report Status PENDING  Incomplete     Studies: Ct Angio Chest Pe W/cm &/or Wo Cm  03/17/2015  CLINICAL DATA:  64 year old female with pleuritic chest pain. EXAM: CT ANGIOGRAPHY CHEST WITH CONTRAST TECHNIQUE: Multidetector CT imaging of the chest was performed using the standard protocol during  bolus administration of intravenous contrast. Multiplanar CT image reconstructions and MIPs were obtained to evaluate the vascular anatomy. CONTRAST:  OMNIPAQUE IOHEXOL 350 MG/ML SOLN COMPARISON:  No priors. FINDINGS: Mediastinum/Lymph Nodes: There are no filling defects within the pulmonary arterial tree to suggest underlying pulmonary embolism. Heart size is normal. There is no significant pericardial fluid, thickening or pericardial calcification. There is atherosclerosis of the thoracic aorta, the great vessels of the mediastinum and the coronary arteries, including calcified atherosclerotic plaque in the left main and left circumflex coronary arteries. Prominent borderline enlarged bilateral hilar lymph nodes measuring up to 9 mm are presumably reactive. Mildly enlarged 1 cm short axis subcarinal lymph node, also likely reactive. Several densely calcified left hilar lymph nodes are noted. Aberrant right subclavian artery (normal anatomical variant) incidentally noted. Esophagus is unremarkable in appearance. No axillary lymphadenopathy. Lungs/Pleura: Multifocal airspace consolidation throughout the lungs bilaterally, with the largest area of consolidation in the posterior aspect of the right lower lobe. Several of the areas of airspace consolidation are slightly nodular in appearance, however, the overall appearance is favored to reflect a multilobar pneumonia. Trace dependent bilateral  pleural effusions. Mild diffuse bronchial wall thickening. Septal thickening throughout the lower lobes of the lungs bilaterally. A few scattered calcified granulomas are noted in the left lung. Upper Abdomen: Status post cholecystectomy. Musculoskeletal/Soft Tissues: There are no aggressive appearing lytic or blastic lesions noted in the visualized portions of the skeleton. Review of the MIP images confirms the above findings. IMPRESSION: 1. No evidence of pulmonary embolism. 2. Multilobar pneumonia, most severe in the right lower lobe, as above. 3. Trace bilateral pleural effusions. 4. Atherosclerosis, including left main and left circumflex coronary artery disease. Please note that although the presence of coronary artery calcium documents the presence of coronary artery disease, the severity of this disease and any potential stenosis cannot be assessed on this non-gated CT examination. Assessment for potential risk factor modification, dietary therapy or pharmacologic therapy may be warranted, if clinically indicated. 5. Sequela of old granulomatous disease, as above. 6. Status post cholecystectomy. Electronically Signed   By: Trudie Reed M.D.   On: 03/17/2015 17:38     Scheduled Meds: . azithromycin  500 mg Intravenous Q24H  . cefTRIAXone (ROCEPHIN)  IV  1 g Intravenous Q24H  . enoxaparin (LOVENOX) injection  40 mg Subcutaneous QHS  . estradiol  1 mg Oral Daily  . levothyroxine  88 mcg Oral QAC breakfast  . losartan  25 mg Oral Daily  . oxybutynin  10 mg Oral Daily  . pantoprazole  80 mg Oral Daily  . sodium chloride  3 mL Intravenous Q12H   Continuous Infusions:    Time spent: 35 minutes  Author: Lynden Oxford, MD [facility] Pager: [phone] 03/18/2015 3:59 PM  If 7PM-7AM, please contact night-coverage at [email or web address], password [facility]

## 2015-03-19 ENCOUNTER — Encounter: Payer: Self-pay | Admitting: Internal Medicine

## 2015-03-19 DIAGNOSIS — E871 Hypo-osmolality and hyponatremia: Secondary | ICD-10-CM

## 2015-03-19 LAB — CBC
HCT: 36 % (ref 36.0–46.0)
Hemoglobin: 12 g/dL (ref 12.0–15.0)
MCH: 30.2 pg (ref 26.0–34.0)
MCHC: 33.3 g/dL (ref 30.0–36.0)
MCV: 90.7 fL (ref 78.0–100.0)
PLATELETS: 332 10*3/uL (ref 150–400)
RBC: 3.97 MIL/uL (ref 3.87–5.11)
RDW: 13.5 % (ref 11.5–15.5)
WBC: 12.9 10*3/uL — ABNORMAL HIGH (ref 4.0–10.5)

## 2015-03-19 LAB — LEGIONELLA PNEUMOPHILA SEROGP 1 UR AG: L. PNEUMOPHILA SEROGP 1 UR AG: NEGATIVE

## 2015-03-19 MED ORDER — METOPROLOL SUCCINATE ER 100 MG PO TB24: 100.0000 mg | ORAL_TABLET | Freq: Every day | ORAL | Status: AC

## 2015-03-19 MED ORDER — BENZONATATE 200 MG PO CAPS: 200.0000 mg | ORAL_CAPSULE | Freq: Three times a day (TID) | ORAL | Status: DC | PRN

## 2015-03-19 MED ORDER — CEFPODOXIME PROXETIL 200 MG PO TABS: 200.0000 mg | ORAL_TABLET | Freq: Two times a day (BID) | ORAL | Status: AC

## 2015-03-19 MED ORDER — AZITHROMYCIN 500 MG PO TABS: 500.0000 mg | ORAL_TABLET | Freq: Every day | ORAL | Status: AC

## 2015-03-19 MED ORDER — BENZONATATE 100 MG PO CAPS
200.0000 mg | ORAL_CAPSULE | Freq: Three times a day (TID) | ORAL | Status: DC | PRN
  Administered 2015-03-19: 200 mg via ORAL
  Filled 2015-03-19: qty 2

## 2015-03-19 MED ORDER — AZITHROMYCIN 500 MG PO TABS: 500.0000 mg | ORAL_TABLET | Freq: Every day | ORAL | Status: DC

## 2015-03-19 NOTE — Discharge Summary (Signed)
[facility] Discharge Summary   Patient: Karla Farley    ZOX:[id number] PCP: Sid Falcon, MD    DOB: 1950/07/10 Date of admission: 03/16/2015  Date of discharge: 03/19/2015   Discharge Diagnoses:  Principal Problem:   Lobar pneumonia [facility]) Active Problems:   Community acquired pneumonia   Essential hypertension   Hypokalemia   Hyponatremia   Pleuritic chest pain   Positive D dimer   Recommendations for Outpatient Follow-up:  1. Follow up with PCP to resume home blood pressure medication, get BMP blood work done as well.  Diet recommendation: low salt diet  Activity: The patient is advised to gradually reintroduce usual activities.  Discharge Condition: good  History of present illness:  As per the H&P dictated by Dr. Arbutus Leas on 03/16/2015 Patient presented with 5 day history of cough and malaise. Patient was also reporting to have shingles and was started on Valtrex. He was found to be having WBC of 22 with chest x-ray showed right middle lobe opacity and lactic acid elevation and was admitted for community-acquired pneumonia.  Hospital Course:  The patient presented with complaints of cough and malaise and was found to be having right middle lobe pneumonia. Summary of her active problems in the hospital is as following.  1. Lobar pneumonia [facility]) Patient presented with complains of left-sided chest pain.  She was found to having right-sided pneumonia. Blood cultures remain negative. Streptococcal and Legionella antigen negative, no sputum specimen are received from the patient CT scan showed multilobar pneumonia including left upper lobe.  2. Pleuritic chest pain. Less likely cardiac. Patient is taking estrogen on a daily basis. With this the patient is at high risk for developing thromboembolism. D-dimer is also positive. CT PE is performed which is negative for any blood clot and shows multilobar pneumonia as well as atherosclerosis.   3 Essential  hypertension Blood pressure currently stable. Patient was initially off of all the blood pressure medications, later on losartan was resumed,  Patient will resume metoprolol after one week after following up with the PCP. Patient was recommended to discontinue hydrochlorothiazide due to significantly improved blood pressure.  4 Hypokalemia Hyponatremia Resolved at present.  5. Hypothyroidism. Continuing Synthroid.  All other chronic medical condition were stable during the hospitalization. Patient was ambulatory without any assistance On the day of the discharge the patient's oxygenation remained stable and pain improved, and no other acute medical condition were reported by patient. the patient was felt safe to be discharge at home with family support.  Procedures and Results:  none   Consultations:  none  Discharge Exam: Filed Weights   03/17/15 0432 03/18/15 0441 03/19/15 0454  Weight: 73.256 kg (161 lb 8 oz) 72.984 kg (160 lb 14.4 oz) 73.074 kg (161 lb 1.6 oz)   Filed Vitals:   03/19/15 0454 03/19/15 1041  BP: 125/56 132/63  Pulse: 77 90  Temp: 97.8 F (36.6 C)   Resp: 18     General: alert awake and oriented in no acute distress Cardiovascular: S1 and S2 present Respiratory:  Bilateral crackles Abdomen: bowel sounds present  DISCHARGE MEDICATION: Discharge Instructions    Diet - low sodium heart healthy    Complete by:  As directed      Increase activity slowly    Complete by:  As directed           Discharge Medication List as of 03/19/2015 11:29 AM    START taking these medications   Details  azithromycin (ZITHROMAX) 500 MG tablet  Take 1 tablet (500 mg total) by mouth daily., Starting 03/19/2015, Until Discontinued, Print    benzonatate (TESSALON) 200 MG capsule Take 1 capsule (200 mg total) by mouth 3 (three) times daily as needed for cough., Starting 03/19/2015, Until Discontinued, Print    cefpodoxime (VANTIN) 200 MG tablet Take 1 tablet (200 mg  total) by mouth 2 (two) times daily., Starting 03/19/2015, Until Fri 03/29/15, Print      CONTINUE these medications which have CHANGED   Details  metoprolol succinate (TOPROL-XL) 100 MG 24 hr tablet Take 1 tablet (100 mg total) by mouth daily. Take with or immediately following a meal., Starting 03/26/2015, Until Discontinued, No Print      CONTINUE these medications which have NOT CHANGED   Details  estradiol (ESTRACE) 1 MG tablet Take 1 mg by mouth daily., Until Discontinued, Historical Med    fluticasone (FLONASE) 50 MCG/ACT nasal spray Place 1 spray into both nostrils daily as needed for allergies or rhinitis. , Until Discontinued, Historical Med    HYDROcodone-acetaminophen (NORCO/VICODIN) 5-325 MG tablet Take 1 tablet by mouth every 6 (six) hours as needed for moderate pain., Until Discontinued, Historical Med    levothyroxine (SYNTHROID, LEVOTHROID) 88 MCG tablet Take 88 mcg by mouth daily before breakfast., Until Discontinued, Historical Med    losartan (COZAAR) 25 MG tablet Take 25 mg by mouth daily., Until Discontinued, Historical Med    omeprazole (PRILOSEC) 40 MG capsule Take 40 mg by mouth daily., Until Discontinued, Historical Med    oxybutynin (DITROPAN-XL) 10 MG 24 hr tablet Take 10 mg by mouth daily. , Until Discontinued, Historical Med      STOP taking these medications     hydrochlorothiazide (HYDRODIURIL) 25 MG tablet      ibuprofen (ADVIL,MOTRIN) 200 MG tablet        Allergies  Allergen Reactions  . Demerol [Meperidine]     hypotension   Follow-up Information    Follow up with Sid Falcon, MD On 04/17/2015.   Specialty:  Family Medicine   Why:  @  8:20 am   ... confirmed w/ Herbert Seta ... Dr. will call patient to schedule an earlier appt .Marland Kitchen   Contact information:   [address] [place] [address] [phone]       The results of significant diagnostics from this hospitalization (including imaging, microbiology, ancillary and  laboratory) are listed below for reference.    Significant Diagnostic Studies: Dg Chest 2 View  03/16/2015  CLINICAL DATA:  LEFT upper flank pain.  Nonproductive cough. EXAM: CHEST  2 VIEW COMPARISON:  None. FINDINGS: Normal cardiac silhouette. RIGHT infrahilar opacity seen only on the frontal projection likely represents RIGHT middle lobe pneumonia. LEFT lung is clear. No pneumothorax. No acute osseous abnormality. IMPRESSION: Concern for RIGHT middle lobe pneumonia. Followup PA and lateral chest X-ray is recommended in 3-4 weeks following trial of antibiotic therapy to ensure resolution and exclude underlying malignancy. Electronically Signed   By: Genevive Bi M.D.   On: 03/16/2015 12:13   Ct Angio Chest Pe W/cm &/or Wo Cm  03/17/2015  CLINICAL DATA:  64 year old female with pleuritic chest pain. EXAM: CT ANGIOGRAPHY CHEST WITH CONTRAST TECHNIQUE: Multidetector CT imaging of the chest was performed using the standard protocol during bolus administration of intravenous contrast. Multiplanar CT image reconstructions and MIPs were obtained to evaluate the vascular anatomy. CONTRAST:  OMNIPAQUE IOHEXOL 350 MG/ML SOLN COMPARISON:  No priors. FINDINGS: Mediastinum/Lymph Nodes: There are no filling defects within the  pulmonary arterial tree to suggest underlying pulmonary embolism. Heart size is normal. There is no significant pericardial fluid, thickening or pericardial calcification. There is atherosclerosis of the thoracic aorta, the great vessels of the mediastinum and the coronary arteries, including calcified atherosclerotic plaque in the left main and left circumflex coronary arteries. Prominent borderline enlarged bilateral hilar lymph nodes measuring up to 9 mm are presumably reactive. Mildly enlarged 1 cm short axis subcarinal lymph node, also likely reactive. Several densely calcified left hilar lymph nodes are noted. Aberrant right subclavian artery (normal anatomical variant) incidentally  noted. Esophagus is unremarkable in appearance. No axillary lymphadenopathy. Lungs/Pleura: Multifocal airspace consolidation throughout the lungs bilaterally, with the largest area of consolidation in the posterior aspect of the right lower lobe. Several of the areas of airspace consolidation are slightly nodular in appearance, however, the overall appearance is favored to reflect a multilobar pneumonia. Trace dependent bilateral pleural effusions. Mild diffuse bronchial wall thickening. Septal thickening throughout the lower lobes of the lungs bilaterally. A few scattered calcified granulomas are noted in the left lung. Upper Abdomen: Status post cholecystectomy. Musculoskeletal/Soft Tissues: There are no aggressive appearing lytic or blastic lesions noted in the visualized portions of the skeleton. Review of the MIP images confirms the above findings. IMPRESSION: 1. No evidence of pulmonary embolism. 2. Multilobar pneumonia, most severe in the right lower lobe, as above. 3. Trace bilateral pleural effusions. 4. Atherosclerosis, including left main and left circumflex coronary artery disease. Please note that although the presence of coronary artery calcium documents the presence of coronary artery disease, the severity of this disease and any potential stenosis cannot be assessed on this non-gated CT examination. Assessment for potential risk factor modification, dietary therapy or pharmacologic therapy may be warranted, if clinically indicated. 5. Sequela of old granulomatous disease, as above. 6. Status post cholecystectomy. Electronically Signed   By: Trudie Reedaniel  Entrikin M.D.   On: 03/17/2015 17:38    Microbiology: Recent Results (from the past 240 hour(s))  Urine culture     Status: None   Collection Time: 03/16/15  6:42 PM  Result Value Ref Range Status   Specimen Description URINE, CLEAN CATCH  Final   Special Requests NONE  Final   Culture NO GROWTH 2 DAYS  Final   Report Status 03/18/2015 FINAL   Final  Culture, blood (routine x 2)     Status: None (Preliminary result)   Collection Time: 03/16/15  7:07 PM  Result Value Ref Range Status   Specimen Description BLOOD RIGHT ARM  Final   Special Requests BOTTLES DRAWN AEROBIC AND ANAEROBIC 6CC  Final   Culture NO GROWTH 3 DAYS  Final   Report Status PENDING  Incomplete  Culture, blood (routine x 2)     Status: None (Preliminary result)   Collection Time: 03/16/15  7:18 PM  Result Value Ref Range Status   Specimen Description BLOOD RIGHT HAND  Final   Special Requests BOTTLES DRAWN AEROBIC AND ANAEROBIC 5CC   Final   Culture NO GROWTH 3 DAYS  Final   Report Status PENDING  Incomplete     Labs: CBC:  Recent Labs Lab 03/16/15 1221 03/17/15 0238 03/18/15 0352 03/19/15 0306  WBC 22.2* 16.8* 12.2* 12.9*  NEUTROABS 20.7*  --   --   --   HGB 13.6 11.5* 11.0* 12.0  HCT 39.9 34.0* 33.1* 36.0  MCV 88.1 89.7 90.4 90.7  PLT 377 299 293 332   Basic Metabolic Panel:  Recent Labs Lab 03/16/15 1221 03/17/15 0238  03/18/15 0352  NA 134* 139 138  K 2.9* 3.6 3.1*  CL 97* 105 105  CO2 GLUCOSE 142* 118* 100*  BUN CREATININE 1.14* 0.98 0.83  CALCIUM 8.7* 8.1* 8.0*  MG  --  1.9  --    Liver Function Tests:  Recent Labs Lab 03/16/15 1221  AST 30  ALT 23  ALKPHOS 117  BILITOT 1.0  PROT 7.7  ALBUMIN 3.1*   Cardiac Enzymes:  Recent Labs Lab 03/16/15 1221  TROPONINI <0.03   Time spent: 35 minutes  Signed:  Lorrane Mccay  [facility] 03/19/2015, 11:36 PM

## 2015-03-19 NOTE — Progress Notes (Signed)
All d/c instruction explained and given  to pt,   Verbalized understanding.  Karla PeaNellie Jaliyah Farley, Charity fundraiserN.

## 2015-03-19 NOTE — Progress Notes (Signed)
At bedtime patient had a coughing spell and therefore administered PRN cough syrup but was not effective. This morning patient is coughing and would like to get something that would be effective. Asked if patient ever took a Tessalon pearl and patient stated yes. Paged hospitalist to see if an order could be put in for tessalon pearl to help with cough for patient states it was effective when she had taken it before. Awaiting orders and will continue to monitor patient to end of shift.

## 2015-03-19 NOTE — Care Management Important Message (Signed)
Important Message  Patient Details  Name: Karla Farley MRN: [id number] Date of Birth: Oct 06, 1950   Medicare Important Message Given:  Yes    Oralia RudMegan P Denis Carreon 03/19/2015, 8:48 AM

## 2015-03-19 NOTE — Discharge Instructions (Signed)
° °  It is important that you read following instructions as well as go over your medication list with RN to help you understand your care after this hospitalization.  Discharge Instructions: 1. Follow up with PCP to resume home blood pressure medication, get BMP blood work done as well.   Diet recommendation: low salt diet  Activity: The patient is advised to gradually reintroduce usual activities.  Please request your primary care physician to go over all Hospital Tests and Procedure/Radiological results at the follow up,  Please get all Hospital records sent to your PCP by signing hospital release before you go home.    You were cared for by a hospitalist during your hospital stay. If you have any questions about your discharge medications or the care you received while you were in the hospital after you are discharged, you can call the unit and ask to speak with the hospitalist on call if the hospitalist that took care of you is not available.   Once you are discharged, your primary care physician will handle any further medical issues.  Please note that NO REFILLS for any discharge medications will be authorized once you are discharged, as it is imperative that you return to your primary care physician (or establish a relationship with a primary care physician if you do not have one) for your aftercare needs so that they can reassess your need for medications and monitor your lab values.  You Must read complete instructions/literature along with all the possible adverse reactions/side effects for all the Medicines you take and that have been prescribed to you. Take any new Medicines after you have completely understood and accept all the possible adverse reactions/side effects.  Wear Seat belts while driving.

## 2015-03-19 NOTE — Progress Notes (Signed)
At 1304 pt d/c off floor to awaiting transport to home.  Amanda PeaNellie Nasiah Lehenbauer, RN

## 2015-03-19 NOTE — Progress Notes (Signed)
Tessalon pearls given per PRN order. Will continue to monitor patient to end of shift.

## 2015-03-21 LAB — CULTURE, BLOOD (ROUTINE X 2)
CULTURE: NO GROWTH
Culture: NO GROWTH

## 2022-05-18 ENCOUNTER — Emergency Department (HOSPITAL_BASED_OUTPATIENT_CLINIC_OR_DEPARTMENT_OTHER): Payer: Medicare HMO

## 2022-05-18 ENCOUNTER — Other Ambulatory Visit: Payer: Self-pay

## 2022-05-18 ENCOUNTER — Emergency Department (HOSPITAL_BASED_OUTPATIENT_CLINIC_OR_DEPARTMENT_OTHER)
Admission: EM | Admit: 2022-05-18 | Discharge: 2022-05-18 | Disposition: A | Discharge status: Home / Self Care | Payer: Medicare HMO | Attending: Emergency Medicine | Admitting: Emergency Medicine

## 2022-05-18 ENCOUNTER — Encounter (HOSPITAL_BASED_OUTPATIENT_CLINIC_OR_DEPARTMENT_OTHER): Payer: Self-pay

## 2022-05-18 DIAGNOSIS — J069 Acute upper respiratory infection, unspecified: Secondary | ICD-10-CM | POA: Diagnosis not present

## 2022-05-18 DIAGNOSIS — Z1152 Encounter for screening for COVID-19: Secondary | ICD-10-CM | POA: Diagnosis not present

## 2022-05-18 DIAGNOSIS — R059 Cough, unspecified: Secondary | ICD-10-CM | POA: Diagnosis present

## 2022-05-18 LAB — RESP PANEL BY RT-PCR (RSV, FLU A&B, COVID)  RVPGX2
Influenza A by PCR: NEGATIVE
Influenza B by PCR: NEGATIVE
Resp Syncytial Virus by PCR: NEGATIVE
SARS Coronavirus 2 by RT PCR: NEGATIVE

## 2022-05-18 LAB — CBC WITH DIFFERENTIAL/PLATELET
Abs Immature Granulocytes: 0.21 10*3/uL — ABNORMAL HIGH (ref 0.00–0.07)
Basophils Absolute: 0.1 10*3/uL (ref 0.0–0.1)
Basophils Relative: 1 %
Eosinophils Absolute: 0.1 10*3/uL (ref 0.0–0.5)
Eosinophils Relative: 1 %
HCT: 40.6 % (ref 36.0–46.0)
Hemoglobin: 13.6 g/dL (ref 12.0–15.0)
Immature Granulocytes: 1 %
Lymphocytes Relative: 9 %
Lymphs Abs: 1.8 10*3/uL (ref 0.7–4.0)
MCH: 29.8 pg (ref 26.0–34.0)
MCHC: 33.5 g/dL (ref 30.0–36.0)
MCV: 89 fL (ref 80.0–100.0)
Monocytes Absolute: 1.5 10*3/uL — ABNORMAL HIGH (ref 0.1–1.0)
Monocytes Relative: 8 %
Neutro Abs: 16.1 10*3/uL — ABNORMAL HIGH (ref 1.7–7.7)
Neutrophils Relative %: 80 %
Platelets: 333 10*3/uL (ref 150–400)
RBC: 4.56 MIL/uL (ref 3.87–5.11)
RDW: 12.7 % (ref 11.5–15.5)
WBC: 19.9 10*3/uL — ABNORMAL HIGH (ref 4.0–10.5)
nRBC: 0 % (ref 0.0–0.2)

## 2022-05-18 LAB — COMPREHENSIVE METABOLIC PANEL
ALT: 37 U/L (ref 0–44)
AST: 47 U/L — ABNORMAL HIGH (ref 15–41)
Albumin: 3.3 g/dL — ABNORMAL LOW (ref 3.5–5.0)
Alkaline Phosphatase: 118 U/L (ref 38–126)
Anion gap: 12 (ref 5–15)
BUN: 26 mg/dL — ABNORMAL HIGH (ref 8–23)
CO2: 26 mmol/L (ref 22–32)
Calcium: 9.2 mg/dL (ref 8.9–10.3)
Chloride: 94 mmol/L — ABNORMAL LOW (ref 98–111)
Creatinine, Ser: 1.09 mg/dL — ABNORMAL HIGH (ref 0.44–1.00)
GFR, Estimated: 54 mL/min — ABNORMAL LOW (ref >60)
Glucose, Bld: 137 mg/dL — ABNORMAL HIGH (ref 70–99)
Potassium: 3 mmol/L — ABNORMAL LOW (ref 3.5–5.1)
Sodium: 132 mmol/L — ABNORMAL LOW (ref 135–145)
Total Bilirubin: 0.5 mg/dL (ref 0.3–1.2)
Total Protein: 8.4 g/dL — ABNORMAL HIGH (ref 6.5–8.1)

## 2022-05-18 LAB — TROPONIN I (HIGH SENSITIVITY): Troponin I (High Sensitivity): 15 ng/L (ref <18)

## 2022-05-18 MED ORDER — AMOXICILLIN 500 MG PO CAPS: 1000.0000 mg | ORAL_CAPSULE | Freq: Two times a day (BID) | ORAL | 0 refills | Status: AC

## 2022-05-18 NOTE — Discharge Instructions (Addendum)
Take tylenol 2 pills 4 times a day and motrin 4 pills 3 times a day.  Drink plenty of fluids.  Return for worsening shortness of breath, headache, confusion. Follow up with your family doctor.  ° °

## 2022-05-18 NOTE — ED Provider Notes (Signed)
[facility] EMERGENCY DEPARTMENT AT [place] [facility] Provider Note   CSN: [id number] Arrival date & time: 05/18/22  0940     History  Chief Complaint  Patient presents with   Shortness of Breath    Karla Farley is a 72 y.o. female.  72 yo F with a chief complaints of cough and congestion.  This has been going on for about a week.  She had been seen in urgent care and was started on some Tessalon Perles without significant improvement.  No fevers or chills.  No chest pain.  Mild fatigue.  Decreased oral intake.   Shortness of Breath      Home Medications Prior to Admission medications   Medication Sig Start Date End Date Taking? Authorizing Provider  amoxicillin (AMOXIL) 500 MG capsule Take 2 capsules (1,000 mg total) by mouth 2 (two) times daily for 7 days. 05/18/22 05/25/22 Yes Deno Etienne, DO  azithromycin (ZITHROMAX) 500 MG tablet Take 1 tablet (500 mg total) by mouth daily. 03/19/15   Lavina Hamman, MD  benzonatate (TESSALON) 200 MG capsule Take 1 capsule (200 mg total) by mouth 3 (three) times daily as needed for cough. 03/19/15   Lavina Hamman, MD  estradiol (ESTRACE) 1 MG tablet Take 1 mg by mouth daily.    [provider]  fluticasone (FLONASE) 50 MCG/ACT nasal spray Place 1 spray into both nostrils daily as needed for allergies or rhinitis.     [provider]  HYDROcodone-acetaminophen (NORCO/VICODIN) 5-325 MG tablet Take 1 tablet by mouth every 6 (six) hours as needed for moderate pain.    [provider]  levothyroxine (SYNTHROID, LEVOTHROID) 88 MCG tablet Take 88 mcg by mouth daily before breakfast.    [provider]  losartan (COZAAR) 25 MG tablet Take 25 mg by mouth daily.    [provider]  metoprolol succinate (TOPROL-XL) 100 MG 24 hr tablet Take 1 tablet (100 mg total) by mouth daily. Take with or immediately following a meal. 03/26/15   Lavina Hamman, MD  omeprazole (PRILOSEC) 40 MG capsule Take 40 mg by mouth  daily.    [provider]  oxybutynin (DITROPAN-XL) 10 MG 24 hr tablet Take 10 mg by mouth daily.     [provider]      Allergies    Demerol [meperidine]    Review of Systems   Review of Systems  Respiratory:  Positive for shortness of breath.     Physical Exam Updated Vital Signs BP 135/62   Pulse 82   Temp 98.3 F (36.8 C) (Oral)   Resp (!) 26   Ht 5\' 3"  (1.6 m)   Wt 67.1 kg   SpO2 94%   BMI 26.22 kg/m  Physical Exam Vitals and nursing note reviewed.  Constitutional:      General: She is not in acute distress.    Appearance: She is well-developed. She is not diaphoretic.  HENT:     Head: Normocephalic and atraumatic.     Comments: Swollen turbinates, posterior nasal drip, no noted sinus ttp, right TM with purulent effusion and bulging. Eyes:     Pupils: Pupils are equal, round, and reactive to light.  Cardiovascular:     Rate and Rhythm: Normal rate and regular rhythm.     Heart sounds: No murmur heard.    No friction rub. No gallop.  Pulmonary:     Effort: Pulmonary effort is normal.     Breath sounds: No wheezing or rales.  Abdominal:     General: There is no distension.     Palpations: Abdomen is soft.     Tenderness: There is no abdominal tenderness.  Musculoskeletal:        General: No tenderness.     Cervical back: Normal range of motion and neck supple.  Skin:    General: Skin is warm and dry.  Neurological:     Mental Status: She is alert and oriented to person, place, and time.  Psychiatric:        Behavior: Behavior normal.     ED Results / Procedures / Treatments   Labs (all labs ordered are listed, but only abnormal results are displayed) Labs Reviewed  CBC WITH DIFFERENTIAL/PLATELET - Abnormal; Notable for the following components:      Result Value   WBC 19.9 (*)    Neutro Abs 16.1 (*)    Monocytes Absolute 1.5 (*)    Abs Immature Granulocytes 0.21 (*)    All other components within normal limits  COMPREHENSIVE  METABOLIC PANEL - Abnormal; Notable for the following components:   Sodium 132 (*)    Potassium 3.0 (*)    Chloride 94 (*)    Glucose, Bld 137 (*)    BUN 26 (*)    Creatinine, Ser 1.09 (*)    Total Protein 8.4 (*)    Albumin 3.3 (*)    AST 47 (*)    GFR, Estimated 54 (*)    All other components within normal limits  RESP PANEL BY RT-PCR (RSV, FLU A&B, COVID)  RVPGX2  TROPONIN I (HIGH SENSITIVITY)    EKG EKG Interpretation  Date/Time:  Monday May 18 2022 12:59:05 EST Ventricular Rate:  92 PR Interval:  118 QRS Duration: 90 QT Interval:  344 QTC Calculation: 426 R Axis:   58 Text Interpretation: Sinus rhythm Borderline short PR interval Baseline wander TECHNICALLY DIFFICULT Confirmed by Melene Plan [phone]) on 05/18/2022 1:09:27 PM  Radiology DG Chest 2 View  Result Date: 05/18/2022 CLINICAL DATA:  Persistent cough with hoarseness, loss of appetite and nausea for 5 days. Question pneumonia. EXAM: CHEST - 2 VIEW COMPARISON:  Radiographs 06/22/2018 and 03/16/2015.  CT 09/13/2018. FINDINGS: The heart size and mediastinal contours are stable. Right greater than left biapical scarring appears unchanged from the previous CT, and no focal airspace disease, edema, pleural effusion or pneumothorax is identified. There are mild degenerative changes throughout the spine. IMPRESSION: No evidence of active cardiopulmonary process. Chronic biapical scarring. Electronically Signed   By: Carey Bullocks M.D.   On: 05/18/2022 10:50    Procedures Procedures    Medications Ordered in ED Medications - No data to display  ED Course/ Medical Decision Making/ A&P                             Medical Decision Making Risk Prescription drug management.   72 yo F with a chief complaints of cough congestion going on for about a week.  Chest x-ray independently interpreted by me without focal infiltrate or pneumothorax.  Mild hypokalemia patient has a leukocytosis which appears to be a chronic  issue for her.  She has some right otitis media clinically on exam.  Will start on antibiotics.  Treat supportively.  PCP follow-up.  2:37 PM:  I have discussed the diagnosis/risks/treatment options with the patient.  Evaluation and diagnostic testing in the emergency department does not suggest an emergent condition requiring admission or immediate intervention  beyond what has been performed at this time.  They will follow up with PCP. We also discussed returning to the ED immediately if new or worsening sx occur. We discussed the sx which are most concerning (e.g., sudden worsening pain, fever, inability to tolerate by mouth) that necessitate immediate return. Medications administered to the patient during their visit and any new prescriptions provided to the patient are listed below.  Medications given during this visit Medications - No data to display   The patient appears reasonably screen and/or stabilized for discharge and I doubt any other medical condition or other [facility] requiring further screening, evaluation, or treatment in the ED at this time prior to discharge.          Final Clinical Impression(s) / ED Diagnoses Final diagnoses:  Upper respiratory tract infection, unspecified type    Rx / DC Orders ED Discharge Orders          Ordered    amoxicillin (AMOXIL) 500 MG capsule  2 times daily        05/18/22 [place], Toben Acuna, DO 05/18/22 1437

## 2022-05-18 NOTE — ED Triage Notes (Signed)
In for eval of persistent cough onset Wednesday with hoarse voice, loss of appetite, nausea. On Thursday seen by urgent care for cough and tightness in chest and SOB. Covid and Flu negative. Diag URI. Rx for cough med.

## 2022-05-18 NOTE — ED Notes (Signed)
States has had a cough, fatigue for the past week, hoarseness in voice. States she has had a low grade fever at times. Poor PO intake for the past week as well. Pt alert and oriented, noted to have a mod dry cough intermittently, follows commands without hesitation. IV established, blood tubes obtained. 12 lead ECG obtained.

## 2022-07-28 ENCOUNTER — Ambulatory Visit: Payer: Medicare HMO | Admitting: Physician Assistant

## 2022-07-28 ENCOUNTER — Encounter: Payer: Self-pay | Admitting: Physician Assistant

## 2022-07-28 VITALS — BP 150/79 | HR 86 | Ht 63.0 in | Wt 155.0 lb

## 2022-07-28 DIAGNOSIS — J209 Acute bronchitis, unspecified: Secondary | ICD-10-CM

## 2022-07-28 DIAGNOSIS — M25572 Pain in left ankle and joints of left foot: Secondary | ICD-10-CM | POA: Diagnosis not present

## 2022-07-28 DIAGNOSIS — M79672 Pain in left foot: Secondary | ICD-10-CM

## 2022-07-28 MED ORDER — PREDNISONE 20 MG PO TABS: ORAL_TABLET | ORAL | 0 refills | Status: AC

## 2022-07-28 MED ORDER — BENZONATATE 100 MG PO CAPS: ORAL_CAPSULE | ORAL | 0 refills | Status: AC

## 2022-07-28 NOTE — Progress Notes (Unsigned)
New Patient Office Visit  Subjective    Patient ID: Karla Farley, female    DOB: 11-20-1950  Age: 72 y.o. MRN: [id number]  CC:  Chief Complaint  Patient presents with   Cough    Started Saturday, progressed over the weekend. C/o left pain, swollen around the ankle and bottom, denies any falls     HPI Karla Farley presents with 2 different complaints.  States that she started having a productive cough with greenish-yellow sputum and wheezing approximately 4 days ago.  States the cough is keeping her awake at night.  States that she also has been having some discomfort in her right ear.  Denies sick contacts.  Does take Zyrtec on a daily basis, otherwise has not taken anything for relief.  States that she has been having pain in her left ankle and the bottom of her left foot.  States that her ankle has been swollen over the past couple of days, states that ankle is swollen even upon waking.  States pain is worse when she first gets out of bed.  Denies injury or trauma.  States that she does have a history of foot pain, last saw podiatry in 2020.  States that she does wear her Brooks running shoes on a daily basis.  States that she has not tried anything for relief.   Note from last podiatry visit from October 17, 2018 DIAGNOSIS:  1. 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 Numbness and tingling of both feet  PLAN:  Discussed treatment options with the patient and have recommended the following: Karla Farley is a pleasant, 72 y.o. active female who presents today for f/u of bilateral forefoot numbness, burning. 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 The soft tissue fat pad atrophy could also be contributing to neuritis type burning/numbness. Overall, patient >80% improved and seems to be making good progress. Supportive running shoe at all times when  ambulating.. May wear sandals intermittently. Avoid going barefoot around house. Keep toes warm as much as possible. She may try warm epsom salt soaks daily in evening. All questions answered today Call or return to clinic as needed if these symptoms worsen or fail to improve as anticipated.    Outpatient Encounter Medications as of 07/28/2022  Medication Sig   albuterol (VENTOLIN HFA) 108 (90 Base) MCG/ACT inhaler Inhale 1-2 puffs into the lungs every 6 (six) hours as needed.   benzonatate (TESSALON) 100 MG capsule Take 1-2 caps PO TID PRN   estradiol (ESTRACE) 1 MG tablet Take 1 mg by mouth daily.   famotidine (PEPCID) 20 MG tablet Take 20 mg by mouth daily.   fluticasone (FLONASE) 50 MCG/ACT nasal spray Place 1 spray into both nostrils daily.   HYDROcodone-acetaminophen (NORCO/VICODIN) 5-325 MG tablet Take 1 tablet by mouth every 6 (six) hours as needed for moderate pain.   levothyroxine (SYNTHROID, LEVOTHROID) 88 MCG tablet Take 88 mcg by mouth daily before breakfast.   losartan (COZAAR) 25 MG tablet Take 25 mg by mouth daily.   metoprolol succinate (TOPROL-XL) 100 MG 24 hr tablet Take 1 tablet (100 mg total) by mouth daily. Take with or immediately following a meal. (Patient taking differently: Take 100 mg by mouth daily. Take with or immediately following a meal.)   omeprazole (PRILOSEC) 40 MG capsule Take 40 mg by mouth daily.   oxybutynin (DITROPAN-XL) 10 MG 24  hr tablet Take 10 mg by mouth daily.    predniSONE (DELTASONE) 20 MG tablet Take 3 tablets (60 mg total) by mouth daily with breakfast for 2 days, THEN 2 tablets (40 mg total) daily with breakfast for 2 days, THEN 1 tablet (20 mg total) daily with breakfast for 2 days, THEN 0.5 tablets (10 mg total) daily with breakfast for 2 days.   [DISCONTINUED] fluticasone (FLONASE) 50 MCG/ACT nasal spray Place 1 spray into both nostrils daily as needed for allergies or rhinitis.    azithromycin (ZITHROMAX) 500 MG tablet Take 1 tablet (500 mg  total) by mouth daily. (Patient not taking: Reported on 07/28/2022)   hydrochlorothiazide (HYDRODIURIL) 25 MG tablet Take 25 mg by mouth daily.   pravastatin (PRAVACHOL) 40 MG tablet Take 40 mg by mouth daily.   [DISCONTINUED] benzonatate (TESSALON) 200 MG capsule Take 1 capsule (200 mg total) by mouth 3 (three) times daily as needed for cough. (Patient not taking: Reported on 07/28/2022)   No facility-administered encounter medications on file as of 07/28/2022.    Past Medical History:  Diagnosis Date   Hypertension    Thyroid disease     Past Surgical History:  Procedure Laterality Date   ABDOMINAL HYSTERECTOMY     carpel tunnel      History reviewed. No pertinent family history.  Social History   Socioeconomic History   Marital status: Married    Spouse name: Not on file   Number of children: Not on file   Years of education: Not on file   Highest education level: Not on file  Occupational History   Not on file  Tobacco Use   Smoking status: Never   Smokeless tobacco: Never  Vaping Use   Vaping Use: Never used  Substance and Sexual Activity   Alcohol use: No   Drug use: No   Sexual activity: Not on file  Other Topics Concern   Not on file  Social History Narrative   Not on file   Social Determinants of Health   Financial Resource Strain: Not on file  Food Insecurity: Not on file  Transportation Needs: Not on file  Physical Activity: Not on file  Stress: Not on file  Social Connections: Not on file  Intimate Partner Violence: Not on file    Review of Systems  Constitutional:  Negative for chills and fever.  HENT:  Positive for congestion and ear pain. Negative for sinus pain and sore throat.   Eyes: Negative.   Respiratory:  Positive for cough, sputum production and wheezing. Negative for shortness of breath.   Cardiovascular:  Negative for chest pain.  Gastrointestinal:  Negative for nausea and vomiting.  Genitourinary: Negative.   Musculoskeletal:   Positive for joint pain and myalgias.  Skin: Negative.   Neurological: Negative.   Endo/Heme/Allergies: Negative.   Psychiatric/Behavioral: Negative.          Objective    BP (!) 150/79   Pulse 86   Ht 5\' 3"  (1.6 m)   Wt 155 lb (70.3 kg)   SpO2 94%   BMI 27.46 kg/m   Physical Exam Vitals and nursing note reviewed.  Constitutional:      Appearance: Normal appearance.  HENT:     Head: Normocephalic and atraumatic.     Salivary Glands: Right salivary gland is not diffusely enlarged or tender. Left salivary gland is not diffusely enlarged or tender.     Right Ear: Tympanic membrane, ear canal and external ear normal.  Left Ear: Tympanic membrane, ear canal and external ear normal.     Nose:     Right Turbinates: Enlarged and swollen.     Left Turbinates: Enlarged and swollen.     Right Sinus: No maxillary sinus tenderness or frontal sinus tenderness.     Left Sinus: No maxillary sinus tenderness or frontal sinus tenderness.     Mouth/Throat:     Lips: Pink.     Mouth: Mucous membranes are moist.     Pharynx: Oropharynx is clear.     Tonsils: No tonsillar exudate.  Cardiovascular:     Rate and Rhythm: Normal rate and regular rhythm.     Pulses:          Dorsalis pedis pulses are 2+ on the left side.       Posterior tibial pulses are 2+ on the left side.  Musculoskeletal:     Cervical back: Normal range of motion and neck supple.     Right ankle: Normal.     Left ankle: Swelling present.     Right foot: Normal.     Left foot: Swelling present.     Comments: Ankle edema, slight swelling noted left foot.  Feet:     Left foot:     Skin integrity: Dry skin present.     Toenail Condition: Left toenails are abnormally thick. Fungal disease present. Neurological:     Mental Status: She is alert.         Assessment & Plan:   Problem List Items Addressed This Visit   None Visit Diagnoses     Acute bronchitis, unspecified organism    -  Primary   Relevant  Medications   benzonatate (TESSALON) 100 MG capsule   predniSONE (DELTASONE) 20 MG tablet   Left foot pain       Relevant Orders   DG Foot Complete Left   DG Ankle Complete Left   Acute left ankle pain          1. Acute bronchitis, unspecified organism Trial prednisone taper, Tessalon Perles.  Patient education given on supportive care, red flags given for prompt reevaluation - benzonatate (TESSALON) 100 MG capsule; Take 1-2 caps PO TID PRN  Dispense: 20 capsule; Refill: 0 - predniSONE (DELTASONE) 20 MG tablet; Take 3 tablets (60 mg total) by mouth daily with breakfast for 2 days, THEN 2 tablets (40 mg total) daily with breakfast for 2 days, THEN 1 tablet (20 mg total) daily with breakfast for 2 days, THEN 0.5 tablets (10 mg total) daily with breakfast for 2 days.  Dispense: 13 tablet; Refill: 0  2. Left foot pain Plantar fasciitis vs plantar pad atrophy. Patient education given on supportive care, patient unable to use ibuprofen due to history of GERD, should have some relief with prednisone taper for bronchitis.  Consider follow-up with podiatry - DG Foot Complete Left; Future - DG Ankle Complete Left; Future  3. Acute left ankle pain    I have reviewed the patient's medical history (PMH, PSH, Social History, Family History, Medications, and allergies) , and have been updated if relevant. I spent 30 minutes reviewing chart and  face to face time with patient.    Return if symptoms worsen or fail to improve.   Kasandra Knudsen Mayers, PA-C

## 2022-07-28 NOTE — Patient Instructions (Addendum)
To help with  your foot pain and your cough, you are going to do a steroid taper.  To help with your cough, you can also use Tessalon Perles as directed.  Continue using your Zyrtec.  And I would increase the Flonase to twice daily for the next few days.  I encourage you to keep your foot elevated, wear compression stockings, consider using ice to help with pain.  We will call you with the results of your x-ray when they are available.  Roney Jaffe, PA-C Physician Assistant [facility] [email or web address]  Plantar Fasciitis  Plantar fasciitis is a painful foot condition that affects the heel. It occurs when the band of tissue that connects the toes to the heel bone (plantar fascia) becomes irritated. This can happen as the result of exercising too much or doing other repetitive activities (overuse injury). Plantar fasciitis can cause mild irritation to severe pain that makes it difficult to walk or move. The pain is usually worse in the morning after sleeping, or after sitting or lying down for a period of time. Pain may also be worse after long periods of walking or standing. What are the causes? This condition may be caused by: Standing for long periods of time. Wearing shoes that do not have good arch support. Doing activities that put stress on joints (high-impact activities). This includes ballet and exercise that makes your heart beat faster (aerobic exercise), such as running. Being overweight. An abnormal way of walking (gait). Tight muscles in the back of your lower leg (calf). High arches in your feet or flat feet. Starting a new athletic activity. What are the signs or symptoms? The main symptom of this condition is heel pain. Pain may get worse after the following: Taking the first steps after a time of rest, especially in the morning after awakening, or after you have been sitting or lying down for a while. Long  periods of standing still. Pain may decrease after 30-45 minutes of activity, such as gentle walking. How is this diagnosed? This condition may be diagnosed based on your medical history, a physical exam, and your symptoms. Your health care provider will check for: A tender area on the bottom of your foot. A high arch in your foot or flat feet. Pain when you move your foot. Difficulty moving your foot. You may have imaging tests to confirm the diagnosis, such as: X-rays. Ultrasound. MRI. How is this treated? Treatment for plantar fasciitis depends on how severe your condition is. Treatment may include: Rest, ice, pressure (compression), and raising (elevating) the affected foot. This is called RICE therapy. Your health care provider may recommend RICE therapy along with over-the-counter pain medicines to manage your pain. Exercises to stretch your calves and your plantar fascia. A splint that holds your foot in a stretched, upward position while you sleep (night splint). Physical therapy to relieve symptoms and prevent problems in the future. Injections of steroid medicine (cortisone) to relieve pain and inflammation. Stimulating your plantar fascia with electrical impulses (extracorporeal shock wave therapy). This is usually the last treatment option before surgery. Surgery, if other treatments have not worked after 12 months. Follow these instructions at home: Managing pain, stiffness, and swelling  If directed, put ice on the painful area. To do this: Put ice in a plastic bag, or use a frozen bottle of water. Place a towel between your skin and the bag or bottle. Roll the bottom of your foot over the bag or bottle. Do  this for 20 minutes, 2-3 times a day. Wear athletic shoes that have air-sole or gel-sole cushions, or try soft shoe inserts that are designed for plantar fasciitis. Elevate your foot above the level of your heart while you are sitting or lying down. Activity Avoid  activities that cause pain. Ask your health care provider what activities are safe for you. Do physical therapy exercises and stretches as told by your health care provider. Try activities and forms of exercise that are easier on your joints (low impact). Examples include swimming, water aerobics, and biking. General instructions Take over-the-counter and prescription medicines only as told by your health care provider. Wear a night splint while sleeping, if told by your health care provider. Loosen the splint if your toes tingle, become numb, or turn cold and blue. Maintain a healthy weight, or work with your health care provider to lose weight as needed. Keep all follow-up visits. This is important. Contact a health care provider if you have: Symptoms that do not go away with home treatment. Pain that gets worse. Pain that affects your ability to move or do daily activities. Summary Plantar fasciitis is a painful foot condition that affects the heel. It occurs when the band of tissue that connects the toes to the heel bone (plantar fascia) becomes irritated. Heel pain is the main symptom of this condition. It may get worse after exercising too much or standing still for a long time. Treatment varies, but it usually starts with rest, ice, pressure (compression), and raising (elevating) the affected foot. This is called RICE therapy. Over-the-counter medicines can also be used to manage pain. This information is not intended to replace advice given to you by your health care provider. Make sure you discuss any questions you have with your health care provider. Document Revised: 07/24/2019 Document Reviewed: 07/24/2019 Elsevier Patient Education  2023 [employer]  Acute Bronchitis, Adult  Acute bronchitis is sudden inflammation of the main airways (bronchi) that come off the windpipe (trachea) in the lungs. The swelling causes the airways to get smaller and make more mucus than normal. This  can make it hard to breathe and can cause coughing or noisy breathing (wheezing). Acute bronchitis may last several weeks. The cough may last longer. Allergies, asthma, and exposure to smoke may make the condition worse. What are the causes? This condition can be caused by germs and by substances that irritate the lungs, including: Cold and flu viruses. The most common cause of this condition is the virus that causes the common cold. Bacteria. This is less common. Breathing in substances that irritate the lungs, including: Smoke from cigarettes and other forms of tobacco. Dust and pollen. Fumes from household cleaning products, gases, or burned fuel. Indoor or outdoor air pollution. What increases the risk? The following factors may make you more likely to develop this condition: A weak body's defense system, also called the immune system. A condition that affects your lungs and breathing, such as asthma. What are the signs or symptoms? Common symptoms of this condition include: Coughing. This may bring up clear, yellow, or green mucus from your lungs (sputum). Wheezing. Runny or stuffy nose. Having too much mucus in your lungs (chest congestion). Shortness of breath. Aches and pains, including sore throat or chest. How is this diagnosed? This condition is usually diagnosed based on: Your symptoms and medical history. A physical exam. You may also have other tests, including tests to rule out other conditions, such as pneumonia. These tests include: A  test of lung function. Test of a mucus sample to look for the presence of bacteria. Tests to check the oxygen level in your blood. Blood tests. Chest X-ray. How is this treated? Most cases of acute bronchitis clear up over time without treatment. Your health care provider may recommend: Drinking more fluids to help thin your mucus so it is easier to cough up. Taking inhaled medicine (inhaler) to improve air flow in and out of your  lungs. Using a vaporizer or a humidifier. These are machines that add water to the air to help you breathe better. Taking a medicine that thins mucus and clears congestion (expectorant). Taking a medicine that prevents or stops coughing (cough suppressant). It is not common to take an antibiotic medicine for this condition. Follow these instructions at home:  Take over-the-counter and prescription medicines only as told by your health care provider. Use an inhaler, vaporizer, or humidifier as told by your health care provider. Take two teaspoons (10 mL) of honey at bedtime to lessen coughing at night. Drink enough fluid to keep your urine pale yellow. Do not use any products that contain nicotine or tobacco. These products include cigarettes, chewing tobacco, and vaping devices, such as e-cigarettes. If you need help quitting, ask your health care provider. Get plenty of rest. Return to your normal activities as told by your health care provider. Ask your health care provider what activities are safe for you. Keep all follow-up visits. This is important. How is this prevented? To lower your risk of getting this condition again: Wash your hands often with soap and water for at least 20 seconds. If soap and water are not available, use hand sanitizer. Avoid contact with people who have cold symptoms. Try not to touch your mouth, nose, or eyes with your hands. Avoid breathing in smoke or chemical fumes. Breathing smoke or chemical fumes will make your condition worse. Get the flu shot every year. Contact a health care provider if: Your symptoms do not improve after 2 weeks. You have trouble coughing up the mucus. Your cough keeps you awake at night. You have a fever. Get help right away if you: Cough up blood. Feel pain in your chest. Have severe shortness of breath. Faint or keep feeling like you are going to faint. Have a severe headache. Have a fever or chills that get worse. These  symptoms may represent a serious problem that is an emergency. Do not wait to see if the symptoms will go away. Get medical help right away. Call your local emergency services (911 in the [place] Do not drive yourself to the hospital. Summary Acute bronchitis is inflammation of the main airways (bronchi) that come off the windpipe (trachea) in the lungs. The swelling causes the airways to get smaller and make more mucus than normal. Drinking more fluids can help thin your mucus so it is easier to cough up. Take over-the-counter and prescription medicines only as told by your health care provider. Do not use any products that contain nicotine or tobacco. These products include cigarettes, chewing tobacco, and vaping devices, such as e-cigarettes. If you need help quitting, ask your health care provider. Contact a health care provider if your symptoms do not improve after 2 weeks. This information is not intended to replace advice given to you by your health care provider. Make sure you discuss any questions you have with your health care provider. Document Revised: 07/17/2021 Document Reviewed: 08/07/2020 Elsevier Patient Education  2023 [employer].

## 2022-07-29 ENCOUNTER — Encounter: Payer: Self-pay | Admitting: Physician Assistant

## 2022-07-30 ENCOUNTER — Ambulatory Visit (HOSPITAL_BASED_OUTPATIENT_CLINIC_OR_DEPARTMENT_OTHER)
Admission: RE | Admit: 2022-07-30 | Discharge: 2022-07-30 | Disposition: A | Discharge status: Home / Self Care | Payer: Medicare HMO | Source: Ambulatory Visit | Attending: Physician Assistant | Admitting: Physician Assistant

## 2022-07-30 DIAGNOSIS — M79672 Pain in left foot: Secondary | ICD-10-CM | POA: Insufficient documentation
